# Patient Record
Sex: Male | Born: 1980
Health system: Southern US, Community
[De-identification: ages and names within clinical notes are randomized; demographics above are authoritative.]

## PROBLEM LIST (undated history)

## (undated) DIAGNOSIS — K219 Gastro-esophageal reflux disease without esophagitis: Secondary | ICD-10-CM

## (undated) DIAGNOSIS — E785 Hyperlipidemia, unspecified: Secondary | ICD-10-CM

## (undated) DIAGNOSIS — J329 Chronic sinusitis, unspecified: Secondary | ICD-10-CM

## (undated) DIAGNOSIS — J40 Bronchitis, not specified as acute or chronic: Secondary | ICD-10-CM

## (undated) HISTORY — DX: Gastro-esophageal reflux disease without esophagitis: K21.9

## (undated) HISTORY — PX: NO PAST SURGERIES: SHX2092

## (undated) HISTORY — DX: Hyperlipidemia, unspecified: E78.5

---

## 2006-12-16 ENCOUNTER — Emergency Department (HOSPITAL_COMMUNITY): Admission: EM | Admit: 2006-12-16 | Discharge: 2006-12-17 | Payer: Self-pay | Admitting: Emergency Medicine

## 2006-12-17 ENCOUNTER — Emergency Department (HOSPITAL_COMMUNITY): Admission: EM | Admit: 2006-12-17 | Discharge: 2006-12-18 | Payer: Self-pay | Admitting: Emergency Medicine

## 2009-11-07 ENCOUNTER — Emergency Department (HOSPITAL_COMMUNITY): Admission: EM | Admit: 2009-11-07 | Discharge: 2009-11-07 | Payer: Self-pay | Admitting: Emergency Medicine

## 2010-06-13 ENCOUNTER — Emergency Department (HOSPITAL_COMMUNITY): Admission: EM | Admit: 2010-06-13 | Discharge: 2010-06-13 | Payer: Self-pay | Admitting: Emergency Medicine

## 2010-06-15 ENCOUNTER — Emergency Department (HOSPITAL_COMMUNITY): Admission: EM | Admit: 2010-06-15 | Discharge: 2010-06-15 | Payer: Self-pay | Admitting: Emergency Medicine

## 2010-06-26 ENCOUNTER — Emergency Department (HOSPITAL_COMMUNITY): Admission: EM | Admit: 2010-06-26 | Discharge: 2010-06-26 | Payer: Self-pay | Admitting: Emergency Medicine

## 2010-08-28 ENCOUNTER — Telehealth: Payer: Self-pay | Admitting: Physician Assistant

## 2010-08-28 ENCOUNTER — Ambulatory Visit: Payer: Self-pay | Admitting: Physician Assistant

## 2010-08-28 DIAGNOSIS — D409 Neoplasm of uncertain behavior of male genital organ, unspecified: Secondary | ICD-10-CM

## 2010-08-28 LAB — CONVERTED CEMR LAB
Albumin: 4.5 g/dL (ref 3.5–5.2)
Alkaline Phosphatase: 51 units/L (ref 39–117)
Basophils Absolute: 0 10*3/uL (ref 0.0–0.1)
Basophils Relative: 0 % (ref 0–1)
Blood in Urine, dipstick: NEGATIVE
Calcium: 9.8 mg/dL (ref 8.4–10.5)
Creatinine, Ser: 1.01 mg/dL (ref 0.40–1.50)
Eosinophils Relative: 1 % (ref 0–5)
MCV: 86.4 fL (ref 78.0–100.0)
Monocytes Absolute: 0.5 10*3/uL (ref 0.1–1.0)
Protein, U semiquant: NEGATIVE
RBC: 5 M/uL (ref 4.22–5.81)
Total Protein: 7.1 g/dL (ref 6.0–8.3)
Urobilinogen, UA: 1
WBC Urine, dipstick: NEGATIVE
WBC: 6.8 10*3/uL (ref 4.0–10.5)
pH: 6.5

## 2010-08-29 ENCOUNTER — Encounter: Payer: Self-pay | Admitting: Physician Assistant

## 2010-09-03 ENCOUNTER — Encounter: Payer: Self-pay | Admitting: Physician Assistant

## 2010-09-11 ENCOUNTER — Encounter: Payer: Self-pay | Admitting: Physician Assistant

## 2010-09-13 ENCOUNTER — Telehealth: Payer: Self-pay | Admitting: Physician Assistant

## 2010-09-16 ENCOUNTER — Encounter (INDEPENDENT_AMBULATORY_CARE_PROVIDER_SITE_OTHER): Payer: Self-pay | Admitting: *Deleted

## 2011-01-21 NOTE — Letter (Signed)
Summary: *HSN Results Follow up  [facility]  [address] [place], [place] [address]   Phone: [phone]  Fax: [phone]      08/29/2010   [facility] [address] [place], [place]  [address]   Dear  Mr. Derrick Greene,                            ____S.Drinkard,FNP   ____D. Gore,FNP       ____B. McPherson,MD   ____V. Rankins,MD    ____E. Mulberry,MD    ____N. Daphine Deutscher, FNP  ____D. Reche Dixon, MD    ____K. Philipp Deputy, MD    __x__S. Alben Spittle, PA-C     This letter is to inform you that your recent test(s):  _______Pap Smear    ___x____Lab Test     _______X-ray    ___x____ is within acceptable limits  _______ requires a medication change  _______ requires a follow-up lab visit  _______ requires a follow-up visit with your provider   Comments: Blood counts, liver function, kidney function and thyroid tests are all normal.       _________________________________________________________ If you have any questions, please contact our office                     Sincerely,  Tereso Newcomer PA-C [facility]

## 2011-01-21 NOTE — Letter (Signed)
Summary: PT INFORMATION SHEET  PT INFORMATION SHEET   Imported By: Arta Bruce 08/29/2010 11:54:02  _____________________________________________________________________  External Attachment:    Type:   Image     Comment:   External Document

## 2011-01-21 NOTE — Letter (Signed)
Summary: REQUESTING RECORDS FROM [facility]  REQUESTING RECORDS FROM [facility]   Imported By: Arta Bruce 09/05/2010 09:39:04  _____________________________________________________________________  External Attachment:    Type:   Image     Comment:   External Document

## 2011-01-21 NOTE — Assessment & Plan Note (Signed)
Summary: NP-PEEING BLOOD/BLOCKAGAE///KT   Vital Signs:  Patient profile:   30 year old male Weight:      233 pounds Temp:     98.9 degrees F oral Pulse rate:   80 / minute Pulse rhythm:   regular Resp:     18 per minute BP sitting:   117 / 80  (left arm) Cuff size:   large  Vitals Entered By: Arm[place] Shannon (August 28, 2010 12:35 PM) CC: pt says he has a growth on his [place]... Is Patient Diabetic? No Pain Assessment Patient in pain? no       Does patient need assistance? Functional Status Self care Ambulation Normal   Primary Care Provider:  Tereso Newcomer PA-C  CC:  pt says he has a growth on his uretha....  History of Present Illness: New patient. Has noticed changes in urine stream over the last few months.  He has noticed something on the inside of his penis (in urethra at meatus).  Has noted blood on underwear in the mornings.  No h/o genital warts.  Had GC/chlamydia done in ED in June which was neg.  RPR was neg too.  Went to health dept recently and was told he would be checked for STDs and HIV.  Has not heard anything back.  No pain.  No hematuria.  No fevers.  No night sweats.  No weight loss.  No dysuria.  No frequency or urgency.    Habits & Providers  Alcohol-Tobacco-Diet     Tobacco Status: quit     Year Quit: 2011     Pack years: 5  Exercise-Depression-Behavior     Drug Use: no  Current Medications (verified): 1)  None  Allergies (verified): No Known Drug Allergies  Past History:  Past Medical History: h/o bronchitis  Past Surgical History: Denies surgical history  Family History: Family History Diabetes 1st degree relative - mom no prostate or colon cancer  Social History: Divorced 4 kids Former Smoker Alcohol use-no Drug use-no Smoking Status:  quit Drug Use:  no  Review of Systems GI:  Denies abdominal pain, change in bowel habits, constipation, and diarrhea.  Physical Exam  General:  alert, well-developed, and  well-nourished.   Head:  normocephalic and atraumatic.   Neck:  supple and no cervical lymphadenopathy.   Lungs:  normal breath sounds.   Heart:  normal rate and regular rhythm.   Abdomen:  soft, non-tender, and no hepatomegaly.   Genitalia:  circumcised, no hydrocele, no varicocele, no scrotal masses, and no testicular masses or atrophy.   2-3 small clusters of lesions at the meatus; one nodule is protruding out from opening no bloody discharge  does have small amount of clear discharge Neurologic:  alert & oriented X3 and cranial nerves II-XII intact.   Axillary Nodes:  no R axillary adenopathy and no L axillary adenopathy.   Inguinal Nodes:  no R inguinal adenopathy and no L inguinal adenopathy.     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 etiology ? condyloma check some basic general health labs get records from HD refer to urology  Orders: T-Comprehensive Metabolic Panel [phone]) T-CBC w/Diff [phone]) T-TSH [phone]) Urology Referral (Urology)  Patient Instructions: 1)  Sign form to get records from Health Department.  Need recent lab and test results. 2)  Someone will call you to arrange an appt with urology.  Laboratory Results   Urine Tests  Date/Time Received: August 28, 2010 12:40 PM  Routine Urinalysis   Glucose: negative   (Normal Range: Negative) Bilirubin: negative   (Normal Range: Negative) Ketone: negative   (Normal Range: Negative) Spec. Gravity: 1.020   (Normal Range: 1.003-1.035) Blood: negative   (Normal Range: Negative) pH: 6.5   (Normal Range: 5.0-8.0) Protein: negative   (Normal Range: Negative) Urobilinogen: 1.0   (Normal Range: 0-1) Nitrite: negative   (Normal Range: Negative) Leukocyte Esterace: negative   (Normal Range: Negative)

## 2011-01-21 NOTE — Progress Notes (Signed)
Summary: Urology Referral  Phone Note Outgoing Call   Summary of Call: Please refer to urology at [facility].  He needs to see somone soon.  He has a lesion that is worrisome that needs evaluation.  If you can find out a phone number for me to talk to someone, please send it to me.  If they are willing to see him in the next 2-3 weeks, that would be great.  Initial call taken by: Tereso Newcomer PA-C,  August 28, 2010 1:36 PM  Follow-up for Phone Call        I talk to Marcelino Duster she said that the next available appt is november but she is going to give to  the Dr the records to see if they have an appt sooner  and she will let me know  the ph # [phone] if you  want to call . Follow-up by: Cheryll Dessert,  August 30, 2010 11:27 AM  Additional Follow-up for Phone Call Additional follow up Details #1::        I have left a message with [facility] urology.  They will have a resident call me.  Please get me out of a room when they call. Tereso Newcomer PA-C  August 30, 2010 1:10 PM   I spoke with Okey Dupre and she said the office is busy... the doctor has not had a chance to look over notes but she allowed me to leave Marcelino Duster a message.Marland KitchenMarland KitchenArmenia Shannon  September 03, 2010 4:17 PM     Additional Follow-up for Phone Call Additional follow up Details #2::    Thank you. Tereso Newcomer PA-C  September 03, 2010 5:33 PM

## 2011-01-21 NOTE — Progress Notes (Signed)
Summary: Urology  Phone Note Outgoing Call   Summary of Call: Make sure Camilo Frison knows about appt 10/5 at [facility] urology.  Initial call taken by: Brynda Rim,  September 13, 2010 3:39 PM  Follow-up for Phone Call        Left message on answering machine for pt to call back...Marland KitchenMarland KitchenArmenia Shannon  September 16, 2010 9:27 AM will mail letter.. Armenia Shannon  September 16, 2010 11:10 AM        Past History:  Past Medical History: h/o bronchitis h/o chlamydia tx'd at [facility] 12.2009   Appended Document: Urology Patient kept appt with [facility] urology 10.5.2011

## 2011-01-21 NOTE — Letter (Signed)
Summary: [birth year]  1981-09-04   Imported By: Silvio Pate Stanislawscyk 09/17/2010 15:04:44  _____________________________________________________________________  External Attachment:    Type:   Image     Comment:   External Document

## 2011-01-21 NOTE — Letter (Signed)
Summary: *HSN Results Follow up  [facility]  [address] [place], [place] [address]   Phone: [phone]  Fax: [phone]      09/16/2010   [facility] [address] [place], [place]  [address]   Dear  Mr. Derrick Greene,                            ____S.Drinkard,FNP   ____D. Gore,FNP       ____B. McPherson,MD   ____V. Rankins,MD    ____E. Mulberry,MD    ____N. Daphine Deutscher, FNP  ____D. Reche Dixon, MD    ____K. Philipp Deputy, MD    ____Other     This letter is to inform you that your recent test(s):  _______Pap Smear    _______Lab Test     _______X-ray    _______ is within acceptable limits  _______ requires a medication change  _______ requires a follow-up lab visit  _______ requires a follow-up visit with your provider   Comments:  You have appointment at____________ on _______.       _________________________________________________________ If you have any questions, please contact our office                     Sincerely,  Derrick Greene [facility]

## 2011-02-12 ENCOUNTER — Emergency Department (HOSPITAL_COMMUNITY)
Admission: EM | Admit: 2011-02-12 | Discharge: 2011-02-13 | Disposition: A | Discharge status: Home / Self Care | Payer: Self-pay | Attending: Emergency Medicine | Admitting: Emergency Medicine

## 2011-02-12 ENCOUNTER — Emergency Department (HOSPITAL_COMMUNITY): Payer: Self-pay

## 2011-02-12 DIAGNOSIS — R059 Cough, unspecified: Secondary | ICD-10-CM | POA: Insufficient documentation

## 2011-02-12 DIAGNOSIS — R51 Headache: Secondary | ICD-10-CM | POA: Insufficient documentation

## 2011-02-12 DIAGNOSIS — R0989 Other specified symptoms and signs involving the circulatory and respiratory systems: Secondary | ICD-10-CM | POA: Insufficient documentation

## 2011-02-12 DIAGNOSIS — J329 Chronic sinusitis, unspecified: Secondary | ICD-10-CM | POA: Insufficient documentation

## 2011-02-12 DIAGNOSIS — J3489 Other specified disorders of nose and nasal sinuses: Secondary | ICD-10-CM | POA: Insufficient documentation

## 2011-02-12 DIAGNOSIS — J40 Bronchitis, not specified as acute or chronic: Secondary | ICD-10-CM | POA: Insufficient documentation

## 2011-02-12 DIAGNOSIS — R05 Cough: Secondary | ICD-10-CM | POA: Insufficient documentation

## 2011-02-12 DIAGNOSIS — R079 Chest pain, unspecified: Secondary | ICD-10-CM | POA: Insufficient documentation

## 2011-02-12 DIAGNOSIS — R0609 Other forms of dyspnea: Secondary | ICD-10-CM | POA: Insufficient documentation

## 2011-02-13 ENCOUNTER — Encounter (INDEPENDENT_AMBULATORY_CARE_PROVIDER_SITE_OTHER): Payer: Self-pay | Admitting: Internal Medicine

## 2011-02-18 NOTE — Medication Information (Signed)
Summary: RX Folder/SCRIPT [facility]E TO BE SIGNED BY Armonee Bojanowski  RX Folder/SCRIPT [facility]E TO BE SIGNED BY Jalon Squier   Imported By: Arta Bruce 02/14/2011 11:22:38  _____________________________________________________________________  External Attachment:    Type:   Image     Comment:   External Document

## 2011-03-09 LAB — POCT I-STAT, CHEM 8
BUN: 10 mg/dL (ref 6–23)
Chloride: 106 mEq/L (ref 96–112)
Creatinine, Ser: 1.1 mg/dL (ref 0.4–1.5)
Glucose, Bld: 105 mg/dL — ABNORMAL HIGH (ref 70–99)
HCT: 45 % (ref 39.0–52.0)
Sodium: 140 mEq/L (ref 135–145)
TCO2: 24 mmol/L (ref 0–100)

## 2011-03-09 LAB — URINALYSIS, ROUTINE W REFLEX MICROSCOPIC
Bilirubin Urine: NEGATIVE
Glucose, UA: NEGATIVE mg/dL
Hgb urine dipstick: NEGATIVE
Hgb urine dipstick: NEGATIVE
Urobilinogen, UA: 0.2 mg/dL (ref 0.0–1.0)
Urobilinogen, UA: 0.2 mg/dL (ref 0.0–1.0)
pH: 5.5 (ref 5.0–8.0)

## 2011-03-09 LAB — CBC
HCT: 42.2 % (ref 39.0–52.0)
Hemoglobin: 15.1 g/dL (ref 13.0–17.0)
MCH: 29.6 pg (ref 26.0–34.0)
MCHC: 33.2 g/dL (ref 30.0–36.0)
MCHC: 33.2 g/dL (ref 30.0–36.0)
MCV: 88.9 fL (ref 78.0–100.0)
MCV: 89.1 fL (ref 78.0–100.0)
Platelets: 239 10*3/uL (ref 150–400)
RDW: 14.3 % (ref 11.5–15.5)
WBC: 5.2 10*3/uL (ref 4.0–10.5)

## 2011-03-09 LAB — DIFFERENTIAL
Basophils Absolute: 0 10*3/uL (ref 0.0–0.1)
Basophils Relative: 1 % (ref 0–1)
Eosinophils Absolute: 0 10*3/uL (ref 0.0–0.7)
Monocytes Relative: 8 % (ref 3–12)
Monocytes Relative: 8 % (ref 3–12)
Neutro Abs: 3.2 10*3/uL (ref 1.7–7.7)

## 2011-03-09 LAB — BASIC METABOLIC PANEL
CO2: 27 mEq/L (ref 19–32)
Calcium: 10 mg/dL (ref 8.4–10.5)
Chloride: 108 mEq/L (ref 96–112)
Creatinine, Ser: 1.18 mg/dL (ref 0.4–1.5)
Glucose, Bld: 93 mg/dL (ref 70–99)

## 2011-03-09 LAB — RPR: RPR Ser Ql: NONREACTIVE

## 2011-03-09 LAB — GC/CHLAMYDIA PROBE AMP, GENITAL: Chlamydia, DNA Probe: NEGATIVE

## 2011-03-26 LAB — RAPID STREP SCREEN (MED CTR MEBANE ONLY): Streptococcus, Group A Screen (Direct): NEGATIVE

## 2011-10-17 ENCOUNTER — Inpatient Hospital Stay (INDEPENDENT_AMBULATORY_CARE_PROVIDER_SITE_OTHER)
Admission: RE | Admit: 2011-10-17 | Discharge: 2011-10-17 | Disposition: A | Discharge status: Home / Self Care | Payer: Self-pay | Source: Ambulatory Visit | Attending: Family Medicine | Admitting: Family Medicine

## 2011-10-17 DIAGNOSIS — R05 Cough: Secondary | ICD-10-CM

## 2011-10-17 DIAGNOSIS — J309 Allergic rhinitis, unspecified: Secondary | ICD-10-CM

## 2014-04-18 ENCOUNTER — Encounter (HOSPITAL_COMMUNITY): Payer: Self-pay | Admitting: Emergency Medicine

## 2014-04-18 ENCOUNTER — Emergency Department (HOSPITAL_COMMUNITY)
Admission: EM | Admit: 2014-04-18 | Discharge: 2014-04-19 | Disposition: A | Discharge status: Home / Self Care | Payer: Self-pay | Attending: Emergency Medicine | Admitting: Emergency Medicine

## 2014-04-18 ENCOUNTER — Emergency Department (HOSPITAL_COMMUNITY): Payer: Self-pay

## 2014-04-18 ENCOUNTER — Emergency Department (INDEPENDENT_AMBULATORY_CARE_PROVIDER_SITE_OTHER)
Admission: EM | Admit: 2014-04-18 | Discharge: 2014-04-18 | Disposition: A | Discharge status: Nursing Home (Includes ICF) | Payer: Self-pay | Source: Home / Self Care | Attending: Emergency Medicine | Admitting: Emergency Medicine

## 2014-04-18 DIAGNOSIS — Z87891 Personal history of nicotine dependence: Secondary | ICD-10-CM | POA: Insufficient documentation

## 2014-04-18 DIAGNOSIS — R253 Fasciculation: Secondary | ICD-10-CM

## 2014-04-18 DIAGNOSIS — R259 Unspecified abnormal involuntary movements: Secondary | ICD-10-CM | POA: Insufficient documentation

## 2014-04-18 DIAGNOSIS — Z8709 Personal history of other diseases of the respiratory system: Secondary | ICD-10-CM | POA: Insufficient documentation

## 2014-04-18 HISTORY — DX: Chronic sinusitis, unspecified: J32.9

## 2014-04-18 HISTORY — DX: Bronchitis, not specified as acute or chronic: J40

## 2014-04-18 LAB — CBC WITH DIFFERENTIAL/PLATELET
BASOS PCT: 1 % (ref 0–1)
Basophils Absolute: 0 10*3/uL (ref 0.0–0.1)
EOS ABS: 0.1 10*3/uL (ref 0.0–0.7)
Eosinophils Relative: 1 % (ref 0–5)
HCT: 41.9 % (ref 39.0–52.0)
Hemoglobin: 14.6 g/dL (ref 13.0–17.0)
LYMPHS ABS: 3.4 10*3/uL (ref 0.7–4.0)
Lymphocytes Relative: 51 % — ABNORMAL HIGH (ref 12–46)
MCH: 29.5 pg (ref 26.0–34.0)
MCHC: 34.8 g/dL (ref 30.0–36.0)
MCV: 84.6 fL (ref 78.0–100.0)
MONO ABS: 0.3 10*3/uL (ref 0.1–1.0)
Monocytes Relative: 5 % (ref 3–12)
Neutro Abs: 2.7 10*3/uL (ref 1.7–7.7)
Neutrophils Relative %: 42 % — ABNORMAL LOW (ref 43–77)
Platelets: 234 10*3/uL (ref 150–400)
RBC: 4.95 MIL/uL (ref 4.22–5.81)
RDW: 13.9 % (ref 11.5–15.5)
WBC: 6.5 10*3/uL (ref 4.0–10.5)

## 2014-04-18 LAB — BASIC METABOLIC PANEL
BUN: 17 mg/dL (ref 6–23)
CALCIUM: 9.6 mg/dL (ref 8.4–10.5)
CO2: 26 mEq/L (ref 19–32)
Chloride: 102 mEq/L (ref 96–112)
Creatinine, Ser: 1.11 mg/dL (ref 0.50–1.35)
GFR, EST NON AFRICAN AMERICAN: 87 mL/min — AB (ref >90)
Glucose, Bld: 110 mg/dL — ABNORMAL HIGH (ref 70–99)
Potassium: 4 mEq/L (ref 3.7–5.3)
Sodium: 140 mEq/L (ref 137–147)

## 2014-04-18 MED ORDER — ASPIRIN 81 MG PO CHEW
CHEWABLE_TABLET | ORAL | Status: AC
  Filled 2014-04-18: qty 4

## 2014-04-18 MED ORDER — SODIUM CHLORIDE 0.9 % IV SOLN
INTRAVENOUS | Status: DC
  Administered 2014-04-18: 22:00:00 via INTRAVENOUS

## 2014-04-18 MED ORDER — ASPIRIN 81 MG PO CHEW
324.0000 mg | CHEWABLE_TABLET | Freq: Once | ORAL | Status: AC
  Administered 2014-04-18: 324 mg via ORAL

## 2014-04-18 NOTE — ED Notes (Signed)
Report given to [facility] RN.  

## 2014-04-18 NOTE — ED Provider Notes (Signed)
CSN: [id number]     Arrival date & time 04/18/14  2211 History   First MD Initiated Contact with Patient 04/18/14 2216     Chief Complaint  Patient presents with  . Chest Pain     (Consider location/radiation/quality/duration/timing/severity/associated sxs/prior Treatment) HPI  Derrick Greene is a 33 y.o. male complaining of 2 episodes of twitching to the left side of the chest onset today. Patient also states that the left arm was twitching and that his eye was twitching. Both he and his girlfriend observed muscle twitches when this was happening. Episodes lasted about 5 minutes. He denied any sensation of palpitations, irregular heartbeat, shortness of breath, chest pain, nausea, vomiting, dizziness.  Past Medical History  Diagnosis Date  . Bronchitis   . Frequent sinus infections    History reviewed. No pertinent past surgical history. Family History  Problem Relation Age of Onset  . Diabetes Mother   . Asthma Other    History  Substance Use Topics  . Smoking status: Former Research scientist (life sciences)  . Smokeless tobacco: Not on file  . Alcohol Use: Yes     Comment: occasional    Review of Systems 10 systems reviewed and found to be negative, except as noted in the HPI   Allergies  Review of patient's allergies indicates no known allergies.  Home Medications   Prior to Admission medications   Not on File   BP 114/62  Pulse 68  Temp(Src) 98 F (36.7 C) (Oral)  Resp 14  Ht 5\' 11"  (1.803 m)  Wt 240 lb (108.863 kg)  BMI 33.49 kg/m2  SpO2 98% Physical Exam  Nursing note and vitals reviewed. Constitutional: He is oriented to person, place, and time. He appears well-developed and well-nourished. No distress.  HENT:  Head: Normocephalic.  Mouth/Throat: Oropharynx is clear and moist.  Eyes: Conjunctivae and EOM are normal. Pupils are equal, round, and reactive to light.  Cardiovascular: Normal rate, regular rhythm and intact distal pulses.   Pulmonary/Chest: Effort normal and breath  sounds normal. No stridor. No respiratory distress. He has no wheezes. He has no rales. He exhibits no tenderness.  Abdominal: Soft. Bowel sounds are normal. He exhibits no distension and no mass. There is no tenderness. There is no rebound and no guarding.  Musculoskeletal: Normal range of motion.  Neurological: He is alert and oriented to person, place, and time.  Psychiatric: He has a normal mood and affect.    ED Course  Procedures (including critical care time) Labs Review Labs Reviewed  CBC WITH DIFFERENTIAL - Abnormal; Notable for the following:    Neutrophils Relative % 42 (*)    Lymphocytes Relative 51 (*)    All other components within normal limits  BASIC METABOLIC PANEL - Abnormal; Notable for the following:    Glucose, Bld 110 (*)    GFR calc non Af Amer 87 (*)    All other components within normal limits  TROPONIN I    Imaging Review Dg Chest 2 View  04/18/2014   CLINICAL DATA:  Twitching in the anterior left chest musculature  EXAM: CHEST  2 VIEW  COMPARISON:  None.  FINDINGS: The lungs are well-expanded and clear. The heart and mediastinal structures are normal in appearance. There is no pleural effusion or pneumothorax. The observed portions of the bony thorax appear normal.  IMPRESSION: There is no evidence of active cardiopulmonary disease.   Electronically Signed   By: David  Martinique   On: 04/18/2014 23:38     EKG Interpretation  Date/Time:  Tuesday April 18 2014 22:21:23 EDT Ventricular Rate:  85 PR Interval:  163 QRS Duration: 112 QT Interval:  356 QTC Calculation: 423 R Axis:   40 Text Interpretation:  Sinus rhythm Incomplete right bundle branch block  Confirmed by Christy Gentles  MD, DONALD ([id number]) on 04/18/2014 11:59:47 PM      MDM   Final diagnoses:  Muscle twitching   Filed Vitals:   04/18/14 2219 04/19/14 0048  BP: 129/78 114/62  Pulse: 85 68  Temp: 98 F (36.7 C)   TempSrc: Oral   Resp: 18 14  Height: 5\' 11"  (1.803 m)   Weight: 240 lb  (108.863 kg)   SpO2: 100% 98%    Medications - No data to display  Derrick Greene is a 33 y.o. male presenting with chest twitching. Patient sent from urgent care concerned over a normal EKG. I have discussed the case with attending Dr. Christy Gentles who is personally evaluated the EKG. History not concerning for cardiac nature of chest discomfort. Workup is otherwise negative. Antacid given the patient's request.  Evaluation does not show pathology that would require ongoing emergent intervention or inpatient treatment. Pt is hemodynamically stable and mentating appropriately. Discussed findings and plan with patient/guardian, who agrees with care plan. All questions answered. Return precautions discussed and outpatient follow up given.   Discharge Medication List as of 04/19/2014 12:42 AM    START taking these medications   Details  famotidine (PEPCID) 40 MG tablet Take 0.5 tablets (20 mg total) by mouth daily. OTC, Starting 04/19/2014, Until Discontinued, Print        Note: Portions of this report may have been transcribed using voice recognition software. Every effort was made to ensure accuracy; however, inadvertent computerized transcription errors may be present      Monico Blitz, PA-C 04/20/14 1923

## 2014-04-18 NOTE — Discharge Instructions (Signed)
We have determined that your problem requires further evaluation in the emergency department.  We will take care of your transport there.  Once at the emergency department, you will be evaluated by a provider and they will order whatever treatment or tests they deem necessary.  We cannot guarantee that they will do any specific test or do any specific treatment.  ° °

## 2014-04-18 NOTE — ED Notes (Signed)
Monitor and O2 -2 L / per Joaquim Nam EMT.

## 2014-04-18 NOTE — ED Notes (Signed)
Phlebotomy at the bedside. Xray to take patient after phlebotomy finishes.

## 2014-04-18 NOTE — ED Notes (Signed)
C/o twitching in L chest onset today @ 0800 and again @ 1800 and lasted 10 min.  Pt. was anxious about it.  Hx. Heart murmer.  States he wakes up and spits up in his sleep especially after eating spaghetti.  He thinks he has GERD.

## 2014-04-18 NOTE — ED Notes (Signed)
Per [facility], Patient went to [facility] where he complains of his "chest fluttering" twice today. Urgent care started an IV and gave the patient 324 of Aspirin. Patient denies any pain. Vitals per [facility]: 133/83, 100 % on RA, 80 HR, 20 RR.

## 2014-04-18 NOTE — ED Provider Notes (Addendum)
Chief Complaint   Chief Complaint  Patient presents with  . Spasms    History of Present Illness    Derrick Greene is a 33 year old male without any cardiac history or risk factors who noticed some muscle twitching in the left pectoral area. This would come and go throughout the day. He denies any pain, pressure, tightness, or discomfort in that area. He's had no shortness of breath, nausea, or diaphoresis. He's also had some twitching of his eye lids and muscles of his arms. He denies any vomiting or diarrhea. There no new medications. He denies any muscle pain, weakness, or paresthesias. The patient requested that an EKG be done, since he is concerned about heart issues.  Review of Systems    Other than noted above, the patient denies any of the following symptoms. Systemic:  No fever or chills. Pulmonary:  No cough, wheezing, shortness of breath, sputum production, hemoptysis. Cardiac:  No palpitations, rapid heartbeat, dizziness, presyncope or syncope. GI:  No abdominal pain, heartburn, nausea, or vomiting. Ext:  No leg pain or swelling.  [place]    Past medical history, family history, social history, meds, and allergies were reviewed. He has a history of reflux esophagitis but does not take any medication for it now.  Physical Exam     Vital signs:  There were no vitals taken for this visit. Gen:  Alert, oriented, in no distress, skin warm and dry. Eye:  PERRL, lids and conjunctivas normal.  Sclera non-icteric. ENT:  Mucous membranes moist, pharynx clear. Neck:  Supple, no adenopathy or tenderness.  No JVD. Lungs:  Clear to auscultation, no wheezes, rales or rhonchi.  No respiratory distress. Heart:  Regular rhythm.  No gallops, murmers, clicks or rubs. Chest:  No chest wall tenderness. There is no muscle twitching. Abdomen:  Soft, nontender, no organomegaly or mass.  Bowel sounds normal.  No pulsatile abdominal mass or bruit. Ext:  No edema.  No calf tenderness and Homann's  sign negative.  Pulses full and equal. Skin:  Warm and dry.  No rash. The the eye and I cannot feel the same way was minimal resistance for a routine rule out to get troponin to make sure" and ovale and   EKG Results:  Date: 04/18/2014  Rate: 74  Rhythm: normal sinus rhythm  QRS Axis: normal  Intervals: normal  ST/T Wave abnormalities: ST elevations anteriorly  Conduction Disutrbances:right bundle branch block  Narrative Interpretation: Normal sinus rhythm, incomplete right bundle branch block, ST segment elevation, particularly in V2 and also to a lesser extent in aVL.  Old EKG Reviewed: none available    Course in Urgent [place]         He was begun on IV normal saline and given aspirin 325 mg by mouth. The EKG was refused by Dr. Terrence [place] who felt that this was unlikely to be a true STEMI, but that he should be sent to the emergency department for a routine rule out MI.  Assessment     The encounter diagnosis was Muscle twitching.  ST segment elevation on EKG, no old EKG for comparison, he is not having any chest pain right now, but with the EKG changes, he needs to be ruled out for MI.  Plan     The patient was transferred to the ED via [place] in stable condition.  Medical Decision Making:  33 year old male had muscle twitching in left pectoral area this afternoon.  He denies any chest pain or shortness of breath.  He requested an EKG be done and was found to have ST elevation in V2 and AVL.  He is being sent to the ED to be ruled out for MI.       Harden Mo, MD 04/18/14 2156  Harden Mo, MD 04/18/14 [phone]

## 2014-04-18 NOTE — ED Notes (Signed)
[facility] did not have a truck avail.  EMS has been dispatched.

## 2014-04-19 LAB — TROPONIN I: Troponin I: <0.3 ng/mL (ref <0.30)

## 2014-04-19 MED ORDER — FAMOTIDINE 40 MG PO TABS: 20.0000 mg | ORAL_TABLET | Freq: Every day | ORAL | Status: DC

## 2014-04-19 NOTE — Discharge Instructions (Signed)
Please contact your primary care doctor and let them know that you were seen in the emergency room. They must obtain records for evaluation and further management.   Follow with your primary care doctor for a check up in the next 24-48 hours.  Return to the emergency room IMMEDIATELY if you have any NEW or WORSENING symptoms   Muscle Cramps and Spasms Muscle cramps and spasms occur when a muscle or muscles tighten and you have no control over this tightening (involuntary muscle contraction). They are a common problem and can develop in any muscle. The most common place is in the calf muscles of the leg. Both muscle cramps and muscle spasms are involuntary muscle contractions, but they also have differences:   Muscle cramps are sporadic and painful. They may last a few seconds to a quarter of an hour. Muscle cramps are often more forceful and last longer than muscle spasms.  Muscle spasms may or may not be painful. They may also last just a few seconds or much longer. CAUSES  It is uncommon for cramps or spasms to be due to a serious underlying problem. In many cases, the cause of cramps or spasms is unknown. Some common causes are:   Overexertion.   Overuse from repetitive motions (doing the same thing over and over).   Remaining in a certain position for a long period of time.   Improper preparation, form, or technique while performing a sport or activity.   Dehydration.   Injury.   Side effects of some medicines.   Abnormally low levels of the salts and ions in your blood (electrolytes), especially potassium and calcium. This could happen if you are taking water pills (diuretics) or you are pregnant.  Some underlying medical problems can make it more likely to develop cramps or spasms. These include, but are not limited to:   Diabetes.   Parkinson disease.   Hormone disorders, such as thyroid problems.   Alcohol abuse.   Diseases specific to muscles, joints, and  bones.   Blood vessel disease where not enough blood is getting to the muscles.  HOME CARE INSTRUCTIONS   Stay well hydrated. Drink enough water and fluids to keep your urine clear or pale yellow.  It may be helpful to massage, stretch, and relax the affected muscle.  For tight or tense muscles, use a warm towel, heating pad, or hot shower water directed to the affected area.  If you are sore or have pain after a cramp or spasm, applying ice to the affected area may relieve discomfort.  Put ice in a plastic bag.  Place a towel between your skin and the bag.  Leave the ice on for 15-20 minutes, 03-04 times a day.  Medicines used to treat a known cause of cramps or spasms may help reduce their frequency or severity. Only take over-the-counter or prescription medicines as directed by your caregiver. SEEK MEDICAL CARE IF:  Your cramps or spasms get more severe, more frequent, or do not improve over time.  MAKE SURE YOU:   Understand these instructions.  Will watch your condition.  Will get help right away if you are not doing well or get worse. Document Released: 05/30/2002 Document Revised: 04/04/2013 Document Reviewed: 11/24/2012 [facility] Patient Information 2014 [place], [place].

## 2014-04-21 NOTE — ED Provider Notes (Signed)
Medical screening examination/treatment/procedure(s) were performed by non-physician practitioner and as supervising physician I was immediately available for consultation/collaboration.   EKG Interpretation   Date/Time:  Tuesday April 18 2014 22:21:23 EDT Ventricular Rate:  85 PR Interval:  163 QRS Duration: 112 QT Interval:  356 QTC Calculation: 423 R Axis:   40 Text Interpretation:  Sinus rhythm Incomplete right bundle branch block  Confirmed by Christy Gentles  MD, Fathima Bartl ([id number]) on 04/18/2014 11:59:47 PM        Sharyon Cable, MD 04/21/14 [id number]

## 2014-04-26 ENCOUNTER — Ambulatory Visit: Payer: Self-pay | Attending: Internal Medicine | Admitting: Internal Medicine

## 2014-04-26 ENCOUNTER — Encounter: Payer: Self-pay | Admitting: Internal Medicine

## 2014-04-26 VITALS — BP 95/62 | HR 87 | Temp 98.8°F | Resp 14 | Ht 72.0 in | Wt 241.1 lb

## 2014-04-26 DIAGNOSIS — K219 Gastro-esophageal reflux disease without esophagitis: Secondary | ICD-10-CM | POA: Insufficient documentation

## 2014-04-26 DIAGNOSIS — J40 Bronchitis, not specified as acute or chronic: Secondary | ICD-10-CM | POA: Insufficient documentation

## 2014-04-26 DIAGNOSIS — J329 Chronic sinusitis, unspecified: Secondary | ICD-10-CM | POA: Insufficient documentation

## 2014-04-26 DIAGNOSIS — Z7689 Persons encountering health services in other specified circumstances: Secondary | ICD-10-CM

## 2014-04-26 DIAGNOSIS — Z7189 Other specified counseling: Secondary | ICD-10-CM

## 2014-04-26 NOTE — Progress Notes (Signed)
Patient here to establish care  States 2 year history of GERD

## 2014-04-26 NOTE — Patient Instructions (Signed)
Gastroesophageal Reflux Disease, Adult  Gastroesophageal reflux disease (GERD) happens when acid from your stomach flows up into the esophagus. When acid comes in contact with the esophagus, the acid causes soreness (inflammation) in the esophagus. Over time, GERD may create small holes (ulcers) in the lining of the esophagus.  CAUSES   · Increased body weight. This puts pressure on the stomach, making acid rise from the stomach into the esophagus.  · Smoking. This increases acid production in the stomach.  · Drinking alcohol. This causes decreased pressure in the lower esophageal sphincter (valve or ring of muscle between the esophagus and stomach), allowing acid from the stomach into the esophagus.  · Late evening meals and a full stomach. This increases pressure and acid production in the stomach.  · A malformed lower esophageal sphincter.  Sometimes, no cause is found.  SYMPTOMS   · Burning pain in the lower part of the mid-chest behind the breastbone and in the mid-stomach area. This may occur twice a week or more often.  · Trouble swallowing.  · Sore throat.  · Dry cough.  · Asthma-like symptoms including chest tightness, shortness of breath, or wheezing.  DIAGNOSIS   Your caregiver may be able to diagnose GERD based on your symptoms. In some cases, X-rays and other tests may be done to check for complications or to check the condition of your stomach and esophagus.  TREATMENT   Your caregiver may recommend over-the-counter or prescription medicines to help decrease acid production. Ask your caregiver before starting or adding any new medicines.   HOME CARE INSTRUCTIONS   · Change the factors that you can control. Ask your caregiver for guidance concerning weight loss, quitting smoking, and alcohol consumption.  · Avoid foods and drinks that make your symptoms worse, such as:  · Caffeine or alcoholic drinks.  · Chocolate.  · Peppermint or mint flavorings.  · Garlic and onions.  · Spicy foods.  · Citrus fruits,  such as oranges, lemons, or limes.  · Tomato-based foods such as sauce, chili, salsa, and pizza.  · Fried and fatty foods.  · Avoid lying down for the 3 hours prior to your bedtime or prior to taking a nap.  · Eat small, frequent meals instead of large meals.  · Wear loose-fitting clothing. Do not wear anything tight around your waist that causes pressure on your stomach.  · Raise the head of your bed 6 to 8 inches with wood blocks to help you sleep. Extra pillows will not help.  · Only take over-the-counter or prescription medicines for pain, discomfort, or fever as directed by your caregiver.  · Do not take aspirin, ibuprofen, or other nonsteroidal anti-inflammatory drugs (NSAIDs).  SEEK IMMEDIATE MEDICAL CARE IF:   · You have pain in your arms, neck, jaw, teeth, or back.  · Your pain increases or changes in intensity or duration.  · You develop nausea, vomiting, or sweating (diaphoresis).  · You develop shortness of breath, or you faint.  · Your vomit is green, yellow, black, or looks like coffee grounds or blood.  · Your stool is red, bloody, or black.  These symptoms could be signs of other problems, such as heart disease, gastric bleeding, or esophageal bleeding.  MAKE SURE YOU:   · Understand these instructions.  · Will watch your condition.  · Will get help right away if you are not doing well or get worse.  Document Released: 09/17/2005 Document Revised: 03/01/2012 Document Reviewed: 06/27/2011  [employer]® Patient   Information ©2014 [employer].

## 2014-04-27 NOTE — Progress Notes (Signed)
Patient ID: Derrick Greene, male   DOB: 31-Mar-1981, 31 y.o.   MRN: [id number]  EGB:[id number]  [id number]  DOB - 08/28/1981  CC:  Chief Complaint  Patient presents with  . Establish Care  . Gastrophageal Reflux       HPI: Derrick Greene is a 33 y.o. male here today to establish medical care.  Patient reports that he was seen in the emergency department over a week ago for acid reflux. Patient states that he was vomiting in his sleep and epigastric pain. This reports that he was given Pepcid in the emergency department which has helped his symptoms. Patient states he is only taking Pepcid as needed and continues to have intermittent symptoms.   No Known Allergies Past Medical History  Diagnosis Date  . Bronchitis   . Frequent sinus infections   . GERD (gastroesophageal reflux disease)    Current Outpatient Prescriptions on File Prior to Visit  Medication Sig Dispense Refill  . famotidine (PEPCID) 40 MG tablet Take 0.5 tablets (20 mg total) by mouth daily. OTC  30 tablet  0   No current facility-administered medications on file prior to visit.   Family History  Problem Relation Age of Onset  . Diabetes Mother   . Asthma Other    History   Social History  . Marital Status: Divorced    Spouse Name: N/A    Number of Children: N/A  . Years of Education: N/A   Occupational History  . Not on file.   Social History Main Topics  . Smoking status: Former Research scientist (life sciences)  . Smokeless tobacco: Not on file  . Alcohol Use: Yes     Comment: occasional  . Drug Use: No  . Sexual Activity: Not on file   Other Topics Concern  . Not on file   Social History Narrative  . No narrative on file    Review of Systems: Constitutional: Negative for fever, chills, diaphoresis, activity change, appetite change and fatigue. HENT: Negative for ear pain, nosebleeds, congestion, facial swelling, rhinorrhea, neck pain, neck stiffness and ear discharge.  Eyes: Negative for pain, discharge, redness,  itching and visual disturbance. Respiratory: Negative for cough, choking, chest tightness, shortness of breath, wheezing and stridor.  Cardiovascular: Negative for chest pain, palpitations and leg swelling. Gastrointestinal: Negative for abdominal distention. Positive for intermittent heartburn and epigastric pain Genitourinary: Negative for dysuria, urgency, frequency, hematuria, flank pain, decreased urine volume, difficulty urinating and dyspareunia.  Musculoskeletal: Negative for back pain, joint swelling, arthralgia and gait problem. Neurological: Negative for dizziness, tremors, seizures, syncope, facial asymmetry, speech difficulty, weakness, light-headedness, numbness and headaches.  Hematological: Negative for adenopathy. Does not bruise/bleed easily. Psychiatric/Behavioral: Negative for hallucinations, behavioral problems, confusion, dysphoric mood, decreased concentration and agitation.    Objective:   Filed Vitals:   04/26/14 1558  BP: 95/62  Pulse: 87  Temp: 98.8 F (37.1 C)  Resp: 14    Physical Exam: Constitutional: Patient appears well-developed and well-nourished. No distress. HENT: Normocephalic, atraumatic, External right and left ear normal. Oropharynx is clear and moist.  Eyes: Conjunctivae and EOM are normal. PERRLA, no scleral icterus. Neck: Normal ROM. Neck supple. No JVD. No tracheal deviation. No thyromegaly. CVS: RRR, S1/S2 +, no murmurs, no gallops, no carotid bruit.  Pulmonary: Effort and breath sounds normal, no stridor, rhonchi, wheezes, rales.  Abdominal: Soft. BS +, no distension, tenderness, rebound or guarding.  Musculoskeletal: Normal range of motion. No edema and no tenderness.  Lymphadenopathy: No lymphadenopathy noted, cervical Neuro: Alert. Normal reflexes,  muscle tone coordination. No cranial nerve deficit. Skin: Skin is warm and dry. No rash noted. Not diaphoretic. No erythema. No pallor. Psychiatric: Normal mood and affect. Behavior,  judgment, thought content normal.  Lab Results  Component Value Date   WBC 6.5 04/18/2014   HGB 14.6 04/18/2014   HCT 41.9 04/18/2014   MCV 84.6 04/18/2014   PLT 234 04/18/2014   Lab Results  Component Value Date   CREATININE 1.11 04/18/2014   BUN 17 04/18/2014   NA 140 04/18/2014   K 4.0 04/18/2014   CL 102 04/18/2014   CO2 26 04/18/2014    No results found for this basename: HGBA1C   Lipid Panel  No results found for this basename: chol, trig, hdl, cholhdl, vldl, ldlcalc       Assessment and plan:   Andric was seen today for establish care and gastrophageal reflux.  Diagnoses and associated orders for this visit:  Acid reflux Patient may continue current regimen of Pepcid. If continued to have symptoms with Pepcid may return for medication review. Patient education provided on proper way to take medication to prevent symptoms. Diet discussed.  Encounter to establish care - TSH; Future - Hemoglobin A1c; Future - Lipid panel; Future  Return in about 1 week (around 05/03/2014), or if symptoms worsen or fail to improve, for Lab Visit.    Chari Manning, NP-C [facility] [phone] 04/27/2014, 3:19 PM

## 2014-05-03 ENCOUNTER — Encounter: Payer: Self-pay | Admitting: Internal Medicine

## 2014-05-04 ENCOUNTER — Other Ambulatory Visit: Payer: Self-pay

## 2014-05-04 ENCOUNTER — Encounter: Payer: Self-pay | Admitting: Internal Medicine

## 2014-07-21 ENCOUNTER — Emergency Department (HOSPITAL_COMMUNITY)
Admission: EM | Admit: 2014-07-21 | Discharge: 2014-07-21 | Disposition: A | Discharge status: Home / Self Care | Payer: Self-pay | Attending: Emergency Medicine | Admitting: Emergency Medicine

## 2014-07-21 ENCOUNTER — Emergency Department (HOSPITAL_COMMUNITY): Payer: Self-pay

## 2014-07-21 ENCOUNTER — Encounter (HOSPITAL_COMMUNITY): Payer: Self-pay | Admitting: Emergency Medicine

## 2014-07-21 DIAGNOSIS — S0993XA Unspecified injury of face, initial encounter: Secondary | ICD-10-CM | POA: Insufficient documentation

## 2014-07-21 DIAGNOSIS — H05229 Edema of unspecified orbit: Secondary | ICD-10-CM | POA: Insufficient documentation

## 2014-07-21 DIAGNOSIS — Z8709 Personal history of other diseases of the respiratory system: Secondary | ICD-10-CM | POA: Insufficient documentation

## 2014-07-21 DIAGNOSIS — S199XXA Unspecified injury of neck, initial encounter: Secondary | ICD-10-CM

## 2014-07-21 DIAGNOSIS — S0990XA Unspecified injury of head, initial encounter: Secondary | ICD-10-CM | POA: Insufficient documentation

## 2014-07-21 DIAGNOSIS — K219 Gastro-esophageal reflux disease without esophagitis: Secondary | ICD-10-CM | POA: Insufficient documentation

## 2014-07-21 DIAGNOSIS — Z87891 Personal history of nicotine dependence: Secondary | ICD-10-CM | POA: Insufficient documentation

## 2014-07-21 MED ORDER — TRAMADOL HCL 50 MG PO TABS: 50.0000 mg | ORAL_TABLET | Freq: Four times a day (QID) | ORAL | Status: DC | PRN

## 2014-07-21 NOTE — ED Provider Notes (Signed)
Medical screening examination/treatment/procedure(s) were performed by non-physician practitioner and as supervising physician I was immediately available for consultation/collaboration.   EKG Interpretation None        Blanchard Kelch, MD 07/21/14 [phone]
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
## 2014-07-21 NOTE — Discharge Instructions (Signed)
Take Tramadol as needed for pain. Refer to attached documents for more information. Follow up with your doctor as needed.  °
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
## 2015-03-18 ENCOUNTER — Emergency Department (INDEPENDENT_AMBULATORY_CARE_PROVIDER_SITE_OTHER)
Admission: EM | Admit: 2015-03-18 | Discharge: 2015-03-18 | Disposition: A | Discharge status: Home / Self Care | Payer: Self-pay | Source: Home / Self Care | Attending: Family Medicine | Admitting: Family Medicine

## 2015-03-18 ENCOUNTER — Encounter (HOSPITAL_COMMUNITY): Payer: Self-pay | Admitting: Emergency Medicine

## 2015-03-18 DIAGNOSIS — J209 Acute bronchitis, unspecified: Secondary | ICD-10-CM

## 2015-03-18 DIAGNOSIS — M542 Cervicalgia: Secondary | ICD-10-CM

## 2015-03-18 MED ORDER — HYDROCOD POLST-CHLORPHEN POLST 10-8 MG/5ML PO LQCR: 5.0000 mL | Freq: Two times a day (BID) | ORAL | Status: DC | PRN

## 2015-03-18 NOTE — ED Provider Notes (Signed)
CSN: [id number]     Arrival date & time 03/18/15  1342 History   First MD Initiated Contact with Patient 03/18/15 1409     Chief Complaint  Patient presents with  . Cough   (Consider location/radiation/quality/duration/timing/severity/associated sxs/prior Treatment) HPI             34 year old male presents complaining of cough and neck pain. This started approximately 5 days ago. He had cough, subjective fever and chills, sinus pain and pressure, and pain in his lower back. He is still having the cough but the other symptoms are mostly gotten better. 2 days ago he started having pain in the back of his neck. He has noted that this is worse with looking up at the ceiling. He denies any further fever, nausea, vomiting, or headache. He denies any falls or trauma. He has never had pain in this area before. Denies any rash. He researched his symptoms online and he is concerned about meningitis.  Past Medical History  Diagnosis Date  . Bronchitis   . Frequent sinus infections   . GERD (gastroesophageal reflux disease)    History reviewed. No pertinent past surgical history. Family History  Problem Relation Age of Onset  . Diabetes Mother   . Asthma Other    History  Substance Use Topics  . Smoking status: Former Research scientist (life sciences)  . Smokeless tobacco: Not on file  . Alcohol Use: Yes     Comment: occasional    Review of Systems  Constitutional: Positive for chills and fatigue. Negative for fever.  HENT: Positive for rhinorrhea and sinus pressure. Negative for ear pain and sore throat.   Respiratory: Positive for cough. Negative for shortness of breath.   Cardiovascular: Negative for chest pain.  Gastrointestinal: Negative for nausea, vomiting, abdominal pain and diarrhea.  Musculoskeletal: Positive for neck pain. Negative for neck stiffness.  Skin: Negative for rash.  All other systems reviewed and are negative.   Allergies  Review of patient's allergies indicates no known allergies.  Home  Medications   Prior to Admission medications   Medication Sig Start Date End Date Taking? Authorizing Provider  calcium carbonate (TUMS - DOSED IN MG ELEMENTAL CALCIUM) 500 MG chewable tablet Chew 1 tablet by mouth daily as needed for indigestion or heartburn.    Historical Provider, MD  chlorpheniramine-HYDROcodone (TUSSIONEX PENNKINETIC ER) 10-8 MG/5ML LQCR Take 5 mLs by mouth every 12 (twelve) hours as needed for cough. 03/18/15   Liam Graham, PA-C  ibuprofen (ADVIL,MOTRIN) 200 MG tablet Take 400 mg by mouth every 6 (six) hours as needed for fever, headache or mild pain.    Historical Provider, MD  traMADol (ULTRAM) 50 MG tablet Take 1 tablet (50 mg total) by mouth every 6 (six) hours as needed. 07/21/14   Kaitlyn Szekalski, PA-C   BP 128/84 mmHg  Pulse 77  Temp(Src) 98.5 F (36.9 C) (Oral)  Resp 16  SpO2 98% Physical Exam  Constitutional: He is oriented to person, place, and time. He appears well-developed and well-nourished. No distress.  HENT:  Head: Normocephalic and atraumatic.  Right Ear: External ear normal.  Left Ear: External ear normal.  Nose: Nose normal.  Mouth/Throat: Uvula is midline, oropharynx is clear and moist and mucous membranes are normal. No oropharyngeal exudate.  Eyes: Conjunctivae are normal.  Neck: Trachea normal, normal range of motion and full passive range of motion without pain. Neck supple. Muscular tenderness present. No spinous process tenderness present. Normal range of motion present. No Brudzinski's sign and no  Kernig's sign noted.  Cardiovascular: Normal rate, regular rhythm and normal heart sounds.   Pulmonary/Chest: Effort normal and breath sounds normal. No respiratory distress. He has no wheezes. He has no rales.  Abdominal: Soft. He exhibits no mass. There is no tenderness. There is no rebound and no guarding.  Lymphadenopathy:    He has no cervical adenopathy.  Neurological: He is alert and oriented to person, place, and time. Coordination  normal.  Skin: Skin is warm and dry. No rash noted. He is not diaphoretic.  Psychiatric: He has a normal mood and affect. Judgment normal.  Nursing note and vitals reviewed.   ED Course  Procedures (including critical care time) Labs Review Labs Reviewed - No data to display  Imaging Review No results found.   MDM   1. Acute bronchitis, unspecified organism   2. Neck pain    He is concerned about meningitis. He has neck pain but no neck stiffness, negative Kernig's and Brudzinski's signs. No headache or fever. The most likely is having musculoskeletal pain as well as bronchitis. Treat with Tussionex cough suppressant. Follow-up when necessary  Meds ordered this encounter  Medications  . chlorpheniramine-HYDROcodone (TUSSIONEX PENNKINETIC ER) 10-8 MG/5ML LQCR    Sig: Take 5 mLs by mouth every 12 (twelve) hours as needed for cough.    Dispense:  115 mL    Refill:  0     Liam Graham, PA-C 03/18/15 1454

## 2015-03-18 NOTE — ED Notes (Signed)
C/o  A persistent non productive cough since last Tuesday. Pain in back of neck.   Having no relief with taking nite quil and day quil.  Taking ibuprofen for pain relief.   Denies fever, n/v/d

## 2015-03-18 NOTE — Discharge Instructions (Signed)
You have bronchitis. This is a viral infection that causes of cough that can last up to 3-6 weeks. Take this cough medication as needed, when this is gone you may use a cough suppressant such as Delsym. Increase fluids. If you start to get sicker come back to be checked again.  Acute Bronchitis Bronchitis is inflammation of the airways that extend from the windpipe into the lungs (bronchi). The inflammation often causes mucus to develop. This leads to a cough, which is the most common symptom of bronchitis.  In acute bronchitis, the condition usually develops suddenly and goes away over time, usually in a couple weeks. Smoking, allergies, and asthma can make bronchitis worse. Repeated episodes of bronchitis may cause further lung problems.  CAUSES Acute bronchitis is most often caused by the same virus that causes a cold. The virus can spread from person to person (contagious) through coughing, sneezing, and touching contaminated objects. SIGNS AND SYMPTOMS   Cough.   Fever.   Coughing up mucus.   Body aches.   Chest congestion.   Chills.   Shortness of breath.   Sore throat.  DIAGNOSIS  Acute bronchitis is usually diagnosed through a physical exam. Your health care provider will also ask you questions about your medical history. Tests, such as chest X-rays, are sometimes done to rule out other conditions.  TREATMENT  Acute bronchitis usually goes away in a couple weeks. Oftentimes, no medical treatment is necessary. Medicines are sometimes given for relief of fever or cough. Antibiotic medicines are usually not needed but may be prescribed in certain situations. In some cases, an inhaler may be recommended to help reduce shortness of breath and control the cough. A cool mist vaporizer may also be used to help thin bronchial secretions and make it easier to clear the chest.  HOME CARE INSTRUCTIONS  Get plenty of rest.   Drink enough fluids to keep your urine clear or pale  yellow (unless you have a medical condition that requires fluid restriction). Increasing fluids may help thin your respiratory secretions (sputum) and reduce chest congestion, and it will prevent dehydration.   Take medicines only as directed by your health care provider.  If you were prescribed an antibiotic medicine, finish it all even if you start to feel better.  Avoid smoking and secondhand smoke. Exposure to cigarette smoke or irritating chemicals will make bronchitis worse. If you are a smoker, consider using nicotine gum or skin patches to help control withdrawal symptoms. Quitting smoking will help your lungs heal faster.   Reduce the chances of another bout of acute bronchitis by washing your hands frequently, avoiding people with cold symptoms, and trying not to touch your hands to your mouth, nose, or eyes.   Keep all follow-up visits as directed by your health care provider.  SEEK MEDICAL CARE IF: Your symptoms do not improve after 1 week of treatment.  SEEK IMMEDIATE MEDICAL CARE IF:  You develop an increased fever or chills.   You have chest pain.   You have severe shortness of breath.  You have bloody sputum.   You develop dehydration.  You faint or repeatedly feel like you are going to pass out.  You develop repeated vomiting.  You develop a severe headache. MAKE SURE YOU:   Understand these instructions.  Will watch your condition.  Will get help right away if you are not doing well or get worse. Document Released: 01/15/2005 Document Revised: 04/24/2014 Document Reviewed: 05/31/2013 [facility] Patient Information 2015 [place], [place].  This information is not intended to replace advice given to you by your health care provider. Make sure you discuss any questions you have with your health care provider. ° °

## 2015-04-13 ENCOUNTER — Emergency Department (INDEPENDENT_AMBULATORY_CARE_PROVIDER_SITE_OTHER)
Admission: EM | Admit: 2015-04-13 | Discharge: 2015-04-13 | Disposition: A | Discharge status: Home / Self Care | Payer: Self-pay | Source: Home / Self Care | Attending: Family Medicine | Admitting: Family Medicine

## 2015-04-13 ENCOUNTER — Emergency Department (INDEPENDENT_AMBULATORY_CARE_PROVIDER_SITE_OTHER): Payer: Self-pay

## 2015-04-13 ENCOUNTER — Encounter (HOSPITAL_COMMUNITY): Payer: Self-pay | Admitting: Emergency Medicine

## 2015-04-13 DIAGNOSIS — J209 Acute bronchitis, unspecified: Secondary | ICD-10-CM

## 2015-04-13 MED ORDER — PSEUDOEPH-BROMPHEN-DM 30-2-10 MG/5ML PO SYRP: 10.0000 mL | ORAL_SOLUTION | ORAL | Status: DC | PRN

## 2015-04-13 MED ORDER — PREDNISONE 50 MG PO TABS: 50.0000 mg | ORAL_TABLET | Freq: Every day | ORAL | Status: DC

## 2015-04-13 MED ORDER — ALBUTEROL SULFATE HFA 108 (90 BASE) MCG/ACT IN AERS: 2.0000 | INHALATION_SPRAY | RESPIRATORY_TRACT | Status: AC | PRN

## 2015-04-13 NOTE — ED Provider Notes (Signed)
CSN: [id number]     Arrival date & time 04/13/15  1057 History   First MD Initiated Contact with Patient 04/13/15 1141     Chief Complaint  Patient presents with  . URI   (Consider location/radiation/quality/duration/timing/severity/associated sxs/prior Treatment) HPI        34 year old male presents for evaluation of cough, wheezing, sneezing, rhinorrhea. He was seen here about 3-1/2 weeks ago and was diagnosed with bronchitis. Treated with cough suppressant. He got better and was almost completely better until this last Sunday when he decided to smoke some black and mild and now his symptoms have returned. No chest pain or shortness of breath. No fever  Past Medical History  Diagnosis Date  . Bronchitis   . Frequent sinus infections   . GERD (gastroesophageal reflux disease)    History reviewed. No pertinent past surgical history. Family History  Problem Relation Age of Onset  . Diabetes Mother   . Asthma Other    History  Substance Use Topics  . Smoking status: Former Research scientist (life sciences)  . Smokeless tobacco: Not on file  . Alcohol Use: Yes     Comment: occasional    Review of Systems  Constitutional: Negative for fever and chills.  HENT: Positive for congestion, rhinorrhea and sneezing. Negative for ear pain, sinus pressure and trouble swallowing.   Respiratory: Positive for cough and wheezing.   Cardiovascular: Negative for chest pain.  Neurological: Positive for headaches.  All other systems reviewed and are negative.   Allergies  Review of patient's allergies indicates no known allergies.  Home Medications   Prior to Admission medications   Medication Sig Start Date End Date Taking? Authorizing Provider  albuterol (PROVENTIL HFA;VENTOLIN HFA) 108 (90 BASE) MCG/ACT inhaler Inhale 2 puffs into the lungs every 4 (four) hours as needed for wheezing. 04/13/15   Liam Graham, PA-C  brompheniramine-pseudoephedrine-DM 30-2-10 MG/5ML syrup Take 10 mLs by mouth every 4 (four) hours  as needed. 04/13/15   Liam Graham, PA-C  calcium carbonate (TUMS - DOSED IN MG ELEMENTAL CALCIUM) 500 MG chewable tablet Chew 1 tablet by mouth daily as needed for indigestion or heartburn.    Historical Provider, MD  chlorpheniramine-HYDROcodone (TUSSIONEX PENNKINETIC ER) 10-8 MG/5ML LQCR Take 5 mLs by mouth every 12 (twelve) hours as needed for cough. 03/18/15   Liam Graham, PA-C  ibuprofen (ADVIL,MOTRIN) 200 MG tablet Take 400 mg by mouth every 6 (six) hours as needed for fever, headache or mild pain.    Historical Provider, MD  predniSONE (DELTASONE) 50 MG tablet Take 1 tablet (50 mg total) by mouth daily with breakfast. 04/13/15   Liam Graham, PA-C  traMADol (ULTRAM) 50 MG tablet Take 1 tablet (50 mg total) by mouth every 6 (six) hours as needed. 07/21/14   Kaitlyn Szekalski, PA-C   BP 123/81 mmHg  Pulse 78  Temp(Src) 98.5 F (36.9 C) (Oral)  Resp 16  SpO2 100% Physical Exam  Constitutional: He is oriented to person, place, and time. He appears well-developed and well-nourished. No distress.  HENT:  Head: Normocephalic and atraumatic.  Right Ear: Tympanic membrane, external ear and ear canal normal.  Left Ear: Tympanic membrane, external ear and ear canal normal.  Nose: Nose normal. Right sinus exhibits no maxillary sinus tenderness and no frontal sinus tenderness. Left sinus exhibits no maxillary sinus tenderness and no frontal sinus tenderness.  Mouth/Throat: Uvula is midline, oropharynx is clear and moist and mucous membranes are normal. No oropharyngeal exudate.  Eyes: Conjunctivae are normal.  Neck: Normal range of motion. Neck supple. No JVD present.  Cardiovascular: Normal rate, regular rhythm, normal heart sounds and intact distal pulses.   Pulmonary/Chest: Effort normal and breath sounds normal. No respiratory distress. He has no wheezes. He has no rales.  Lymphadenopathy:    He has no cervical adenopathy.  Neurological: He is alert and oriented to person, place, and  time. Coordination normal.  Skin: Skin is warm and dry. No rash noted. He is not diaphoretic.  Psychiatric: He has a normal mood and affect. Judgment normal.  Nursing note and vitals reviewed.   ED Course  Procedures (including critical care time) Labs Review Labs Reviewed - No data to display  Imaging Review Dg Chest 2 View  04/13/2015   CLINICAL DATA:  Cough  EXAM: CHEST  2 VIEW  COMPARISON:  04/18/2014  FINDINGS: The heart size and mediastinal contours are within normal limits. Both lungs are clear. The visualized skeletal structures are unremarkable.  IMPRESSION: No active cardiopulmonary disease.   Electronically Signed   By: Franchot Gallo M.D.   On: 04/13/2015 12:23     MDM   1. Acute bronchitis, unspecified organism    CXR normal.  Treat symptomatically with proair, cough suppressant, prednisone.  F/u PRN    Meds ordered this encounter  Medications  . predniSONE (DELTASONE) 50 MG tablet    Sig: Take 1 tablet (50 mg total) by mouth daily with breakfast.    Dispense:  5 tablet    Refill:  0    Order Specific Question:  Supervising Provider    Answer:  Billy Fischer [phone]  . albuterol (PROVENTIL HFA;VENTOLIN HFA) 108 (90 BASE) MCG/ACT inhaler    Sig: Inhale 2 puffs into the lungs every 4 (four) hours as needed for wheezing.    Dispense:  1 Inhaler    Refill:  0    Order Specific Question:  Supervising Provider    Answer:  Ihor Gully D [id number]  . brompheniramine-pseudoephedrine-DM 30-2-10 MG/5ML syrup    Sig: Take 10 mLs by mouth every 4 (four) hours as needed.    Dispense:  120 mL    Refill:  2    Order Specific Question:  Supervising Provider    Answer:  Ihor Gully D [place], PA-C 04/13/15 1228

## 2015-04-13 NOTE — ED Notes (Signed)
C/o cold sx onset Monday morning Sx include prod cough, sneezing, wheezing, runny nose Seen here on 3/27 and dx w/bronchitis  Alert, no signs of acute distress.

## 2015-04-13 NOTE — Discharge Instructions (Signed)

## 2016-02-15 ENCOUNTER — Encounter (HOSPITAL_COMMUNITY): Payer: Self-pay | Admitting: *Deleted

## 2016-02-15 DIAGNOSIS — K219 Gastro-esophageal reflux disease without esophagitis: Secondary | ICD-10-CM | POA: Insufficient documentation

## 2016-02-15 DIAGNOSIS — Y9389 Activity, other specified: Secondary | ICD-10-CM | POA: Insufficient documentation

## 2016-02-15 DIAGNOSIS — Y998 Other external cause status: Secondary | ICD-10-CM | POA: Insufficient documentation

## 2016-02-15 DIAGNOSIS — Z8709 Personal history of other diseases of the respiratory system: Secondary | ICD-10-CM | POA: Insufficient documentation

## 2016-02-15 DIAGNOSIS — Z7952 Long term (current) use of systemic steroids: Secondary | ICD-10-CM | POA: Insufficient documentation

## 2016-02-15 DIAGNOSIS — Z23 Encounter for immunization: Secondary | ICD-10-CM | POA: Insufficient documentation

## 2016-02-15 DIAGNOSIS — S61411A Laceration without foreign body of right hand, initial encounter: Secondary | ICD-10-CM | POA: Insufficient documentation

## 2016-02-15 DIAGNOSIS — Z79899 Other long term (current) drug therapy: Secondary | ICD-10-CM | POA: Insufficient documentation

## 2016-02-15 DIAGNOSIS — W228XXA Striking against or struck by other objects, initial encounter: Secondary | ICD-10-CM | POA: Insufficient documentation

## 2016-02-15 DIAGNOSIS — Z87891 Personal history of nicotine dependence: Secondary | ICD-10-CM | POA: Insufficient documentation

## 2016-02-15 DIAGNOSIS — S61206A Unspecified open wound of right little finger without damage to nail, initial encounter: Secondary | ICD-10-CM | POA: Insufficient documentation

## 2016-02-15 DIAGNOSIS — Y9289 Other specified places as the place of occurrence of the external cause: Secondary | ICD-10-CM | POA: Insufficient documentation

## 2016-02-15 NOTE — ED Notes (Signed)
Pt states that he punched through glass today at 1500. States that he had bandaged his cuts but when he took them off this afternoon his pinky finger on his right hand continued to bleed. Right hand appears swollen and is tender to touch.

## 2016-02-16 ENCOUNTER — Emergency Department (HOSPITAL_COMMUNITY): Payer: Self-pay

## 2016-02-16 ENCOUNTER — Emergency Department (HOSPITAL_COMMUNITY)
Admission: EM | Admit: 2016-02-16 | Discharge: 2016-02-16 | Disposition: A | Discharge status: Home / Self Care | Payer: Self-pay | Attending: Emergency Medicine | Admitting: Emergency Medicine

## 2016-02-16 DIAGNOSIS — S61209A Unspecified open wound of unspecified finger without damage to nail, initial encounter: Secondary | ICD-10-CM

## 2016-02-16 DIAGNOSIS — S61411A Laceration without foreign body of right hand, initial encounter: Secondary | ICD-10-CM

## 2016-02-16 MED ORDER — CEPHALEXIN 500 MG PO CAPS: 500.0000 mg | ORAL_CAPSULE | Freq: Four times a day (QID) | ORAL | Status: DC

## 2016-02-16 MED ORDER — TETANUS-DIPHTH-ACELL PERTUSSIS 5-2.5-18.5 LF-MCG/0.5 IM SUSP
0.5000 mL | Freq: Once | INTRAMUSCULAR | Status: AC
Start: 2016-02-16 — End: 2016-02-16
  Administered 2016-02-16: 0.5 mL via INTRAMUSCULAR
  Filled 2016-02-16: qty 0.5

## 2016-02-16 NOTE — Discharge Instructions (Signed)
Read the information below.  Use the prescribed medication as directed.  Please discuss all new medications with your pharmacist.  You may return to the Emergency Department at any time for worsening condition or any new symptoms that concern you.    If you develop redness, swelling, pus draining from the wound, or fevers greater than 100.4, return to the ER immediately for a recheck.     Deep Skin Avulsion A deep skin avulsion is a type of open wound. It often results from a severe injury (trauma) that tears away all layers of the skin or an entire body part. The areas of the body that are most often affected by a deep skin avulsion include the face, lips, ears, nose, and fingers. A deep skin avulsion may make structures below the skin become visible. You may be able to see muscle, bone, nerves, and blood vessels. A deep skin avulsion can also damage important structures beneath the skin. These include tendons, ligaments, nerves, or blood vessels. CAUSES Injuries that often cause a deep skin avulsion include:  Being crushed.  Falling against a jagged surface.  Animal bites.  Gunshot wounds.  Severe burns.  Injuries that involve being dragged, such as bicycle or motorcycle accidents. SYMPTOMS Symptoms of a deep skin avulsion include:  Pain.  Numbness.  Swelling.  A misshapen body part.  Bleeding, which may be heavy.  Fluid leaking from the wound. DIAGNOSIS This condition may be diagnosed with a medical history and physical exam. You may also have X-rays done. TREATMENT The treatment that is chosen for a deep skin avulsion depends on how large and deep the wound is and where it is located. Treatment for all types of avulsions usually starts with:  Controlling the bleeding.  Washing out the wound with a germ-free (sterile) salt-water solution.  Removing dead tissue from the wound. A wound may be closed or left open to heal. This depends on the size and location of the wound  and whether it is likely to become infected. Wounds are usually covered or closed if they expose blood vessels, nerves, bone, or cartilage.  Wounds that are small and clean may be closed with stitches (sutures).  Wounds that cannot be closed with sutures may be covered with a piece of skin (graft) or a skin flap. Skin may be taken from on or near the wound, from another part of the body, or from a donor.  Wounds may be left open if they are hard to close or they may become infected. These wounds heal over time from the bottom up. You may also receive medicine. This may include:  Antibiotics.  A tetanus shot.  Rabies vaccine. HOME CARE INSTRUCTIONS Medicines  Take or apply over-the-counter and prescription medicines only as told by your health care provider.  If you were prescribed an antibiotic, take or apply it as told by your health care provider. Do not stop taking the antibiotic even if your condition improves.  You may get anti-itch medicine while your wound is healing. Use it only as told by your health care provider. Wound Care  There are many ways to close and cover a wound. For example, a wound can be covered with sutures, skin glue, or adhesive strips. Follow instructions from your health care provider about:  How to take care of your wound.  When and how you should change your bandage (dressing).  When you should remove your dressing.  Removing whatever was used to close your wound.  Keep  the dressing dry as told by your health care provider. Do not take baths, swim, use a hot tub, or do anything that would put your wound underwater until your health care provider approves.  Clean the wound each day or as told by your health care provider.  Wash the wound with mild soap and water.  Rinse the wound with water to remove all soap.  Pat the wound dry with a clean towel. Do not rub it.  Do not scratch or pick at the wound.  Check your wound every day for signs of  infection. Watch for:  Redness, swelling, or pain.  Fluid, blood, or pus. General Instructions  Raise (elevate) the injured area above the level of your heart while you are sitting or lying down.  Keep all follow-up visits as told by your health care provider. This is important. SEEK MEDICAL CARE IF:  You received a tetanus shot and you have swelling, severe pain, redness, or bleeding at the injection site.  You have a fever.  Your pain is not controlled with medicine.  You have increased redness, swelling, or pain at the site of your wound.  You have fluid, blood, or pus coming from your wound.  You notice a bad smell coming from your wound or your dressing.  A wound that was closed breaks open.  You notice something coming out of the wound, such as wood or glass.  You notice a change in the color of your skin near your wound.  You develop a new rash.  You need to change the dressing frequently due to fluid, blood, or pus draining from the wound. SEEK IMMEDIATE MEDICAL CARE IF:  Your pain suddenly increases and is severe.  You develop severe swelling around the wound.  You develop numbness around the wound.  You have nausea and vomiting that does not go away after 24 hours.  You feel light-headed, weak, or faint.  You develop chest pain.  You have trouble breathing.  Your wound is on your hand or foot and you cannot properly move a finger or toe.  The wound is on your hand or foot and you notice that your fingers or toes look pale or bluish.  You have a red streak going away from your wound.   This information is not intended to replace advice given to you by your health care provider. Make sure you discuss any questions you have with your health care provider.   Document Released: 02/03/2007 Document Revised: 04/24/2015 Document Reviewed: 12/13/2014 Elsevier Interactive Patient Education 2016 [employer]  Nonsutured Laceration Care A laceration is a  cut that goes through all layers of the skin and extends into the tissue that is right under the skin. This type of cut is usually stitched up (sutured) or closed with tape (adhesive strips) or skin glue shortly after the injury happens. However, if the wound is dirty or if several hours pass before medical treatment is provided, it is likely that germs (bacteria) will enter the wound. Closing a laceration after bacteria have entered it increases the risk of infection. In these cases, your health care provider may leave the laceration open (nonsutured) and cover it with a bandage. This type of treatment helps prevent infection and allows the wound to heal from the deepest layer of tissue damage up to the surface. An open fracture is a type of injury that may involve nonsutured lacerations. An open fracture is a break in a bone that happens along with  one or more lacerations through the skin that is near the fracture site. HOW TO CARE FOR YOUR NONSUTURED LACERATION  Take or apply over-the-counter and prescription medicines only as told by your health care provider.  If you were prescribed an antibiotic medicine, take or apply it as told by your health care provider. Do not stop using the antibiotic even if your condition improves.  Clean the wound one time each day or as told by your health care provider.  Wash the wound with mild soap and water.  Rinse the wound with water to remove all soap.  Pat your wound dry with a clean towel. Do not rub the wound.  Do not inject anything into the wound unless your health care provider told you to.  Change any bandages (dressings) as told by your health care provider. This includes changing the dressing if it gets wet, dirty, or starts to smell bad.  Keep the dressing dry until your health care provider says it can be removed. Do not take baths, swim, or do anything that puts your wound underwater until your health care provider approves.  Raise (elevate)  the injured area above the level of your heart while you are sitting or lying down, if possible.  Do not scratch or pick at the wound.  Check your wound every day for signs of infection. Watch for:  Redness, swelling, or pain.  Fluid, blood, or pus.  Keep all follow-up visits as told by your health care provider. This is important. SEEK MEDICAL CARE IF:  You received a tetanus and shot and you have swelling, severe pain, redness, or bleeding at the injection site.   You have a fever.  Your pain is not controlled with medicine.  You have increased redness, swelling, or pain at the site of your wound.  You have fluid, blood, or pus coming from your wound.  You notice a bad smell coming from your wound or your dressing.  You notice something coming out of the wound, such as wood or glass.  You notice a change in the color of your skin near your wound.  You develop a new rash.  You need to change the dressing frequently due to fluid, blood, or pus draining from the wound.  You develop numbness around your wound. SEEK IMMEDIATE MEDICAL CARE IF:  Your pain suddenly increases and is severe.  You develop severe swelling around the wound.  The wound is on your hand or foot and you cannot properly move a finger or toe.  The wound is on your hand or foot and you notice that your fingers or toes look pale or bluish.  You have a red streak going away from your wound.   This information is not intended to replace advice given to you by your health care provider. Make sure you discuss any questions you have with your health care provider.   Document Released: 11/05/2006 Document Revised: 04/24/2015 Document Reviewed: 12/04/2014 Elsevier Interactive Patient Education [employer].

## 2016-02-16 NOTE — ED Provider Notes (Signed)
CSN: [id number]     Arrival date & time 02/15/16  2315 History   First MD Initiated Contact with Patient 02/16/16 0048     Chief Complaint  Patient presents with  . Extremity Laceration     (Consider location/radiation/quality/duration/timing/severity/associated sxs/prior Treatment) The history is provided by the patient.     Right handed pt presents with right hand injury after getting angry and punching through a mirror this afternoon between 2:00-3:00pm.  States he has gotten all of his cuts to stop bleeding with exception of the wound on his right 5th finger.  He is unsure of last Tdap. Denies other injury.  Denies weakness or numbness of the finger.    Past Medical History  Diagnosis Date  . Bronchitis   . Frequent sinus infections   . GERD (gastroesophageal reflux disease)    History reviewed. No pertinent past surgical history. Family History  Problem Relation Age of Onset  . Diabetes Mother   . Asthma Other    Social History  Substance Use Topics  . Smoking status: Former Research scientist (life sciences)  . Smokeless tobacco: None  . Alcohol Use: Yes     Comment: occasional    Review of Systems  Constitutional: Negative for fever and chills.  Musculoskeletal: Positive for arthralgias.  Skin: Positive for wound. Negative for color change, pallor and rash.  Allergic/Immunologic: Negative for immunocompromised state.  Neurological: Negative for weakness and numbness.  Hematological: Does not bruise/bleed easily.  Psychiatric/Behavioral: Positive for self-injury.      Allergies  Review of patient's allergies indicates no known allergies.  Home Medications   Prior to Admission medications   Medication Sig Start Date End Date Taking? Authorizing Provider  albuterol (PROVENTIL HFA;VENTOLIN HFA) 108 (90 BASE) MCG/ACT inhaler Inhale 2 puffs into the lungs every 4 (four) hours as needed for wheezing. 04/13/15   Liam Graham, PA-C  brompheniramine-pseudoephedrine-DM 30-2-10 MG/5ML syrup  Take 10 mLs by mouth every 4 (four) hours as needed. 04/13/15   Liam Graham, PA-C  calcium carbonate (TUMS - DOSED IN MG ELEMENTAL CALCIUM) 500 MG chewable tablet Chew 1 tablet by mouth daily as needed for indigestion or heartburn.    Historical Provider, MD  chlorpheniramine-HYDROcodone (TUSSIONEX PENNKINETIC ER) 10-8 MG/5ML LQCR Take 5 mLs by mouth every 12 (twelve) hours as needed for cough. 03/18/15   Liam Graham, PA-C  ibuprofen (ADVIL,MOTRIN) 200 MG tablet Take 400 mg by mouth every 6 (six) hours as needed for fever, headache or mild pain.    Historical Provider, MD  predniSONE (DELTASONE) 50 MG tablet Take 1 tablet (50 mg total) by mouth daily with breakfast. 04/13/15   Liam Graham, PA-C  traMADol (ULTRAM) 50 MG tablet Take 1 tablet (50 mg total) by mouth every 6 (six) hours as needed. 07/21/14   Kaitlyn Szekalski, PA-C   BP 127/82 mmHg  Pulse 93  Temp(Src) 98.6 F (37 C) (Oral)  Resp 20  Ht 5\' 11"  (1.803 m)  Wt 106.595 kg  BMI 32.79 kg/m2  SpO2 99% Physical Exam  Constitutional: He appears well-developed and well-nourished. No distress.  HENT:  Head: Normocephalic and atraumatic.  Neck: Neck supple.  Pulmonary/Chest: Effort normal.  Musculoskeletal:  Right hand with tiny lacerations over thenar eminence and volar wrist, hemostatic.  Skin avulsion over ulnar aspect 5th finger over PIP, bleeding.  Full active range of motion of all digits, strength 5/5, sensation intact, capillary refill < 2 seconds.   Neurological: He is alert.  Skin: He is not diaphoretic.  Nursing note and vitals reviewed.   ED Course  Procedures (including critical care time) Labs Review Labs Reviewed - No data to display  Imaging Review Dg Hand Complete Right  02/16/2016  CLINICAL DATA:  Laceration to the right fifth finger, after punching hand through glass. Swelling across the palm. Initial encounter. EXAM: RIGHT HAND - COMPLETE 3+ VIEW COMPARISON:  None. FINDINGS: There is no evidence of  fracture or dislocation. Mild negative ulnar variance is noted. There is mild chronic deformity of the distal fifth metacarpal. The joint spaces are preserved. The carpal rows are intact, and demonstrate normal alignment. Soft tissue disruption is noted at the fifth finger. No radiopaque foreign bodies are seen. IMPRESSION: No evidence of fracture or dislocation. Soft tissue disruption at the fifth finger. Electronically Signed   By: Garald Balding M.D.   On: 02/16/2016 01:04   I have personally reviewed and evaluated these images and lab results as part of my medical decision-making.   EKG Interpretation None      MDM   Final diagnoses:  Avulsion of skin of finger, initial encounter  Laceration of hand with delay in treatment, right, initial encounter    Afebrile, nontoxic patient with injury to right hand after punching a mirror.  Xray negative for fracture or retained FB.  Wound thoroughly cleansed.  Dressed using surgifoam and pressure dressing.  Pt advised to monitor and discussed reasons to remove dressing and return precautions.  Other wounds dressed appropriately.  The wounds will do well without repair - there has been a time delay beyond what is typically deemed safe to close the wound but they are also small and well approximated.  Avulsed segment is without possibilty of suture repair - dressed as described above.   D/C home with keflex, return precautions.  Discussed result, findings, treatment, and follow up  with patient.  Pt given return precautions.  Pt verbalizes understanding and agrees with plan.         Clayton Bibles, PA-C [id number]  Delora Fuel, MD [id number]

## 2016-02-16 NOTE — ED Notes (Signed)
Pt is in radiology.

## 2016-03-03 ENCOUNTER — Emergency Department (HOSPITAL_COMMUNITY)
Admission: EM | Admit: 2016-03-03 | Discharge: 2016-03-03 | Disposition: A | Discharge status: Home / Self Care | Payer: Self-pay | Attending: Emergency Medicine | Admitting: Emergency Medicine

## 2016-03-03 ENCOUNTER — Encounter (HOSPITAL_COMMUNITY): Payer: Self-pay | Admitting: Emergency Medicine

## 2016-03-03 DIAGNOSIS — Z792 Long term (current) use of antibiotics: Secondary | ICD-10-CM | POA: Insufficient documentation

## 2016-03-03 DIAGNOSIS — Z8719 Personal history of other diseases of the digestive system: Secondary | ICD-10-CM | POA: Insufficient documentation

## 2016-03-03 DIAGNOSIS — R69 Illness, unspecified: Secondary | ICD-10-CM

## 2016-03-03 DIAGNOSIS — Z79899 Other long term (current) drug therapy: Secondary | ICD-10-CM | POA: Insufficient documentation

## 2016-03-03 DIAGNOSIS — J111 Influenza due to unidentified influenza virus with other respiratory manifestations: Secondary | ICD-10-CM | POA: Insufficient documentation

## 2016-03-03 DIAGNOSIS — R000 Tachycardia, unspecified: Secondary | ICD-10-CM | POA: Insufficient documentation

## 2016-03-03 DIAGNOSIS — R112 Nausea with vomiting, unspecified: Secondary | ICD-10-CM | POA: Insufficient documentation

## 2016-03-03 DIAGNOSIS — Z87891 Personal history of nicotine dependence: Secondary | ICD-10-CM | POA: Insufficient documentation

## 2016-03-03 LAB — RAPID STREP SCREEN (MED CTR MEBANE ONLY): Streptococcus, Group A Screen (Direct): NEGATIVE

## 2016-03-03 MED ORDER — SODIUM CHLORIDE 0.9 % IV BOLUS (SEPSIS)
1000.0000 mL | Freq: Once | INTRAVENOUS | Status: AC
  Administered 2016-03-03: 1000 mL via INTRAVENOUS

## 2016-03-03 MED ORDER — ONDANSETRON 4 MG PO TBDP
4.0000 mg | ORAL_TABLET | Freq: Once | ORAL | Status: AC
  Administered 2016-03-03: 4 mg via ORAL

## 2016-03-03 MED ORDER — ACETAMINOPHEN 325 MG PO TABS
ORAL_TABLET | ORAL | Status: AC
  Filled 2016-03-03: qty 1

## 2016-03-03 MED ORDER — ONDANSETRON 4 MG PO TBDP
ORAL_TABLET | ORAL | Status: AC
  Filled 2016-03-03: qty 1

## 2016-03-03 MED ORDER — HYDROCOD POLST-CPM POLST ER 10-8 MG/5ML PO SUER: 5.0000 mL | Freq: Two times a day (BID) | ORAL | Status: AC | PRN

## 2016-03-03 MED ORDER — ACETAMINOPHEN 325 MG PO TABS
650.0000 mg | ORAL_TABLET | Freq: Once | ORAL | Status: AC | PRN
  Administered 2016-03-03: 650 mg via ORAL

## 2016-03-03 MED ORDER — ACETAMINOPHEN 325 MG PO TABS
ORAL_TABLET | ORAL | Status: AC
  Filled 2016-03-03: qty 2

## 2016-03-03 MED ORDER — KETOROLAC TROMETHAMINE 30 MG/ML IJ SOLN
30.0000 mg | Freq: Once | INTRAMUSCULAR | Status: AC
  Administered 2016-03-03: 30 mg via INTRAVENOUS
  Filled 2016-03-03: qty 1

## 2016-03-03 NOTE — Discharge Instructions (Signed)
Please read and follow all provided instructions.  Your diagnoses today include:  1. Influenza-like illness    Tests performed today include:  Vital signs. See below for your results today.   Strep test - negative  Medications prescribed:   Tussinex - narcotic cough suppressant syrup  You have been prescribed narcotic cough suppressant such as Tussinex: DO NOT drive or perform any activities that require you to be awake and alert because this medicine can make you drowsy.   Take any prescribed medications only as directed.  Home care instructions:  Follow any educational materials contained in this packet. Please continue drinking plenty of fluids. Use over-the-counter cold and flu medications as needed as directed on packaging for symptom relief. You may also use ibuprofen or tylenol as directed on packaging for pain or fever.   BE VERY CAREFUL not to take multiple medicines containing Tylenol (also called acetaminophen). Doing so can lead to an overdose which can damage your liver and cause liver failure and possibly death.   Follow-up instructions: Please follow-up with your primary care provider in the next 3 days for further evaluation of your symptoms.   Return instructions:   Please return to the Emergency Department if you experience worsening symptoms.  Please return if you have a high fever greater than 101 degrees not controlled with over-the-counter medications, persistent vomiting and cannot keep down fluids, or worsening trouble breathing.  Please return if you have any other emergent concerns.  Additional Information:  Your vital signs today were: BP 114/79 mmHg   Pulse 113   Temp(Src) 99.3 F (37.4 C) (Oral)   Resp 18   Ht 5\' 11"  (1.803 m)   Wt 107.956 kg   BMI 33.21 kg/m2   SpO2 96% If your blood pressure (BP) was elevated above 135/85 this visit, please have this repeated by your doctor within one month.

## 2016-03-03 NOTE — ED Notes (Signed)
Pt is in stable condition upon d/c and ambulates from ED. 

## 2016-03-03 NOTE — ED Provider Notes (Signed)
CSN: [id number]     Arrival date & time 03/03/16  0857 History   First MD Initiated Contact with Patient 03/03/16 1045     Chief Complaint  Patient presents with  . Sore Throat  . Cough     (Consider location/radiation/quality/duration/timing/severity/associated sxs/prior Treatment) HPI Comments: Patient presents with three-day history of fever, cough, sore throat, headache, body aches. Patient states that he recently traveled to [place]. Patient has had vomiting since arrival to the emergency department. Patient took over-the-counter medications prior to coming in today and thinks it's related to this. No vomiting prior to ED visit. No chest pain or shortness of breath. No abdominal pain, diarrhea or vomiting. No urinary symptoms. No known sick contacts. Patient did not have flu shot this year. Onset of symptoms acute. Course is constant. Nothing makes symptoms better or worse.  Patient is a 35 y.o. male presenting with pharyngitis and cough. The history is provided by the patient.  Sore Throat Associated symptoms include chills, coughing, fatigue, a fever, headaches, myalgias, nausea, a sore throat and vomiting. Pertinent negatives include no abdominal pain, congestion or rash.  Cough Associated symptoms: chills, fever, headaches, myalgias and sore throat   Associated symptoms: no ear pain, no rash, no rhinorrhea and no wheezing     Past Medical History  Diagnosis Date  . Bronchitis   . Frequent sinus infections   . GERD (gastroesophageal reflux disease)    History reviewed. No pertinent past surgical history. Family History  Problem Relation Age of Onset  . Diabetes Mother   . Asthma Other    Social History  Substance Use Topics  . Smoking status: Former Research scientist (life sciences)  . Smokeless tobacco: None  . Alcohol Use: Yes     Comment: occasional    Review of Systems  Constitutional: Positive for fever, chills and fatigue.  HENT: Positive for sore throat. Negative for congestion, ear  pain, rhinorrhea and sinus pressure.   Eyes: Negative for redness.  Respiratory: Positive for cough. Negative for wheezing.   Gastrointestinal: Positive for nausea and vomiting. Negative for abdominal pain and diarrhea.  Genitourinary: Negative for dysuria.  Musculoskeletal: Positive for myalgias. Negative for neck stiffness.  Skin: Negative for rash.  Neurological: Positive for headaches.  Hematological: Negative for adenopathy.  Psychiatric/Behavioral: Negative for confusion.      Allergies  Review of patient's allergies indicates no known allergies.  Home Medications   Prior to Admission medications   Medication Sig Start Date End Date Taking? Authorizing Provider  albuterol (PROVENTIL HFA;VENTOLIN HFA) 108 (90 BASE) MCG/ACT inhaler Inhale 2 puffs into the lungs every 4 (four) hours as needed for wheezing. 04/13/15   Liam Graham, PA-C  brompheniramine-pseudoephedrine-DM 30-2-10 MG/5ML syrup Take 10 mLs by mouth every 4 (four) hours as needed. 04/13/15   Liam Graham, PA-C  calcium carbonate (TUMS - DOSED IN MG ELEMENTAL CALCIUM) 500 MG chewable tablet Chew 1 tablet by mouth daily as needed for indigestion or heartburn.    Historical Provider, MD  cephALEXin (KEFLEX) 500 MG capsule Take 1 capsule (500 mg total) by mouth 4 (four) times daily. 02/16/16   Clayton Bibles, PA-C  chlorpheniramine-HYDROcodone (TUSSIONEX PENNKINETIC ER) 10-8 MG/5ML LQCR Take 5 mLs by mouth every 12 (twelve) hours as needed for cough. 03/18/15   Liam Graham, PA-C  ibuprofen (ADVIL,MOTRIN) 200 MG tablet Take 400 mg by mouth every 6 (six) hours as needed for fever, headache or mild pain.    Historical Provider, MD  predniSONE (DELTASONE) 50 MG tablet  Take 1 tablet (50 mg total) by mouth daily with breakfast. 04/13/15   Liam Graham, PA-C  traMADol (ULTRAM) 50 MG tablet Take 1 tablet (50 mg total) by mouth every 6 (six) hours as needed. 07/21/14   Kaitlyn Szekalski, PA-C   BP 109/78 mmHg  Pulse 105   Temp(Src) 100 F (37.8 C) (Oral)  Resp 18  Ht 5\' 11"  (1.803 m)  Wt 107.956 kg  BMI 33.21 kg/m2  SpO2 96%   Physical Exam  Constitutional: He appears well-developed and well-nourished.  HENT:  Head: Normocephalic and atraumatic.  Right Ear: Tympanic membrane, external ear and ear canal normal.  Left Ear: Tympanic membrane, external ear and ear canal normal.  Nose: Nose normal. No mucosal edema or rhinorrhea.  Mouth/Throat: Uvula is midline and mucous membranes are normal. Mucous membranes are not dry. No trismus in the jaw. No uvula swelling. Posterior oropharyngeal erythema present. No oropharyngeal exudate, posterior oropharyngeal edema or tonsillar abscesses.  Eyes: Conjunctivae are normal. Right eye exhibits no discharge. Left eye exhibits no discharge.  Neck: Normal range of motion. Neck supple.  Cardiovascular: Regular rhythm and normal heart sounds.  Tachycardia present.   No murmur heard. Pulmonary/Chest: Effort normal and breath sounds normal. No respiratory distress. He has no wheezes. He has no rales.  Abdominal: Soft. There is no tenderness.  Lymphadenopathy:    He has no cervical adenopathy.  Neurological: He is alert.  Skin: Skin is warm and dry.  Psychiatric: He has a normal mood and affect.  Nursing note and vitals reviewed.   ED Course  Procedures (including critical care time) Labs Review Labs Reviewed  RAPID STREP SCREEN (NOT AT [facility])  CULTURE, GROUP A STREP [facility])    Imaging Review No results found. I have personally reviewed and evaluated these images and lab results as part of my medical decision-making.   EKG Interpretation None      11:50 AM Patient seen and examined. Given vomiting, fever, tachycardia -- will give bolus of IV fluids, IV Toradol. Patient appears well, nontoxic. Informed of negative strep test results.  Vital signs reviewed and are as follows: BP 109/78 mmHg  Pulse 105  Temp(Src) 100 F (37.8 C) (Oral)  Resp 18  Ht 5\' 11"   (1.803 m)  Wt 107.956 kg  BMI 33.21 kg/m2  SpO2 96%  1:20 PM Patient has received Toradol and IV fluids. Heart rate has decreased from 120 when I first saw him to 105.  Patient discharged to home. Encouraged to rest and drink plenty of fluids.  Patient told to return to ED or see their primary doctor if their symptoms worsen, high fever not controlled with tylenol, persistent vomiting, they feel they are dehydrated, or if they have any other concerns.  Patient verbalized understanding and agreed with plan.    Patient counseled on use of narcotic cough medications. Counseled not to combine these medications with others containing tylenol. Urged not to drink alcohol, drive, or perform any other activities that requires focus while taking these medications. The patient verbalizes understanding and agrees with the plan.   MDM   Final diagnoses:  Influenza-like illness   Patient with symptoms consistent with influenza. Vitals are stable, low-grade fever. Vomiting upon arrival to the emergency department, now tolerating oral fluids. Lungs are clear, doubt pneumonia. No hypoxia. Supportive therapy indicated with return if symptoms worsen.     Carlisle Cater, PA-C 03/03/16 1322  Julianne Rice, MD 03/12/16 [phone]

## 2016-03-03 NOTE — ED Notes (Signed)
Pt c/o cough and cold symptoms x 3 days. Pt c/o sore throat. Pt denies vomiting or diarrhea, reports able to drink fluids.

## 2016-03-03 NOTE — ED Notes (Signed)
Pt began vomiting in triage

## 2016-03-05 LAB — CULTURE, GROUP A STREP (THRC)

## 2016-04-01 ENCOUNTER — Encounter: Payer: Self-pay | Admitting: Internal Medicine

## 2016-04-01 ENCOUNTER — Ambulatory Visit: Payer: Self-pay | Attending: Internal Medicine | Admitting: Internal Medicine

## 2016-04-01 VITALS — BP 120/90 | HR 84 | Temp 98.8°F | Ht 72.0 in | Wt 246.0 lb

## 2016-04-01 DIAGNOSIS — K219 Gastro-esophageal reflux disease without esophagitis: Secondary | ICD-10-CM

## 2016-04-01 DIAGNOSIS — J302 Other seasonal allergic rhinitis: Secondary | ICD-10-CM

## 2016-04-01 DIAGNOSIS — E669 Obesity, unspecified: Secondary | ICD-10-CM

## 2016-04-01 MED ORDER — LORATADINE 10 MG PO TABS: 10.0000 mg | ORAL_TABLET | Freq: Every day | ORAL | Status: DC

## 2016-04-01 MED ORDER — FLUTICASONE PROPIONATE 50 MCG/ACT NA SUSP: 2.0000 | Freq: Every day | NASAL | Status: DC

## 2016-04-01 MED ORDER — PANTOPRAZOLE SODIUM 40 MG PO TBEC: 40.0000 mg | DELAYED_RELEASE_TABLET | Freq: Every day | ORAL | Status: DC

## 2016-04-01 MED FILL — ?PANTOPRAZOLE SOD DR 40MG: 40 MG | 30 days supply | Qty: 30 | Fill #0

## 2016-04-01 MED FILL — FLUTICASONE PROP 50 MCG SPR: 50 | 30 days supply | Qty: 16 | Fill #0

## 2016-04-01 NOTE — Progress Notes (Signed)
Derrick Greene, is a 35 y.o. male  [id number]  [id number]  DOB - [birth year]  CC:  Chief Complaint  Patient presents with  . Annual Exam       HPI: Derrick Greene is a 35 y.o. male here today to establish medical care.  Last seen in our clinic 5/15, with a few ER visits since.  Per pt, getting over "flu" 1 month ago. Since than c/o of "sinus" bothering him, w/ congestion, dry cough, but w/ intermittant white phlegm production.  C/o of severe acid reflux as well, pepcid helped him at 1 time, but stopped taking once he changed his diet some. He is now not eating after 7pm which has helped his reflux a lot, he takes an occasional TUMS as well.  Of note, he use to be on flonase nasal spray, but stopped due to cost.  Wants to get a check up so he can start exercising again, he hasn't exercise in while, and thus gained some weight since.   Patient has No headache, No chest pain, No abdominal pain - No Nausea, No new weakness tingling or numbness, No Cough - SOB.  No Known Allergies Past Medical History  Diagnosis Date  . Bronchitis   . Frequent sinus infections   . GERD (gastroesophageal reflux disease)    Current Outpatient Prescriptions on File Prior to Visit  Medication Sig Dispense Refill  . acetaminophen (TYLENOL) 500 MG tablet Take 1,000 mg by mouth every 6 (six) hours as needed for fever.    . calcium carbonate (TUMS - DOSED IN MG ELEMENTAL CALCIUM) 500 MG chewable tablet Chew 1 tablet by mouth daily.    Marland Kitchen ibuprofen (ADVIL,MOTRIN) 800 MG tablet Take 800 mg by mouth every 6 (six) hours as needed for mild pain.     Marland Kitchen albuterol (PROVENTIL HFA;VENTOLIN HFA) 108 (90 BASE) MCG/ACT inhaler Inhale 2 puffs into the lungs every 4 (four) hours as needed for wheezing. (Patient not taking: Reported on 04/01/2016) 1 Inhaler 0  . chlorpheniramine-HYDROcodone (TUSSIONEX PENNKINETIC ER) 10-8 MG/5ML SUER Take 5 mLs by mouth every 12 (twelve) hours as needed for cough. (Patient not taking: Reported  on 04/01/2016) 140 mL 0  . ibuprofen (CHILDRENS MOTRIN) 100 MG/5ML suspension Take 200 mg by mouth every 4 (four) hours as needed for mild pain. Reported on 04/01/2016    . Phenylephrine-DM-GG-APAP (TYLENOL COLD/FLU SEVERE PO) Take 2 tablets by mouth every 4 (four) hours. Reported on 04/01/2016     No current facility-administered medications on file prior to visit.   Family History  Problem Relation Age of Onset  . Diabetes Mother   . Asthma Other    Social History   Social History  . Marital Status: Divorced    Spouse Name: N/A  . Number of Children: N/A  . Years of Education: N/A   Occupational History  . Not on file.   Social History Main Topics  . Smoking status: Former Research scientist (life sciences)  . Smokeless tobacco: Not on file  . Alcohol Use: Yes     Comment: occasional  . Drug Use: No  . Sexual Activity: Not on file   Other Topics Concern  . Not on file   Social History Narrative    Review of Systems: Constitutional: Negative for fever, chills, diaphoresis, activity change, appetite change and fatigue.  +weight gain/ HENT: Negative for ear pain, nosebleeds, congestion, facial swelling, rhinorrhea, neck pain, neck stiffness and ear discharge.  Eyes: Negative for pain, discharge, redness, itching and visual disturbance. Respiratory:  Negative for cough, choking, chest tightness, shortness of breath, wheezing and stridor.   +sinus congestion, cough, sputum production (clear) Cardiovascular: Negative for chest pain, palpitations and leg swelling. Gastrointestinal: Negative for abdominal distention. Genitourinary: Negative for dysuria, urgency, frequency, hematuria, flank pain, decreased urine volume, difficulty urinating and dyspareunia.  Musculoskeletal: Negative for back pain, joint swelling, arthralgia and gait problem. Neurological: Negative for dizziness, tremors, seizures, syncope, facial asymmetry, speech difficulty, weakness, light-headedness, numbness and headaches.    Hematological: Negative for adenopathy. Does not bruise/bleed easily. Psychiatric/Behavioral: Negative for hallucinations, behavioral problems, confusion, dysphoric mood, decreased concentration and agitation.    Objective:   Filed Vitals:   04/01/16 1500  BP: 120/90  Pulse: 84  Temp: 98.8 F (37.1 C)     Physical Exam: Constitutional: Patient appears well-developed and well-nourished. No distress. AAOx3, obese HENT: Normocephalic, atraumatic, External right and left ear normal. Bilateral ear effusions noted, clear.  Oropharynx is clear and moist.  Boggy naires, no ttp in facial/max sinuses.   No cobblestoning in OP; no  enlarged /exudated noted on tonsils. Eyes: Conjunctivae and EOM are normal. PERRL, no scleral icterus.  Neck: Normal ROM. Neck supple. No JVD. No tracheal deviation. No thyromegaly. CVS: RRR, S1/S2 +, no murmurs, no gallops, no carotid bruit.  Pulmonary: Effort and breath sounds normal, no stridor, rhonchi, wheezes, rales.  Abdominal: Soft.  Obese, BS +, no distension, tenderness, rebound or guarding.  Musculoskeletal: Normal range of motion. No edema and no tenderness.  Lymphadenopathy: No lymphadenopathy noted, cervical, inguinal or axillary Neuro: Alert. , muscle tone coordination. No cranial nerve deficit grossly. Skin: Skin is warm and dry. No rash noted. Not diaphoretic. No erythema. No pallor. Psychiatric: Normal mood and affect. Behavior, judgment, thought content normal.  Lab Results  Component Value Date   WBC 6.5 04/18/2014   HGB 14.6 04/18/2014   HCT 41.9 04/18/2014   MCV 84.6 04/18/2014   PLT 234 04/18/2014   Lab Results  Component Value Date   CREATININE 1.11 04/18/2014   BUN 17 04/18/2014   NA 140 04/18/2014   K 4.0 04/18/2014   CL 102 04/18/2014   CO2 26 04/18/2014    No results found for: HGBA1C Lipid Panel  No results found for: CHOL, TRIG, HDL, CHOLHDL, VLDL, LDLCALC     Blood Culture    Component Value Date/Time   SDES  THROAT 03/03/2016 1122   SPECREQUEST NONE Reflexed from [id number] 03/03/2016 1122   CULT NO GROUP A STREP (S.PYOGENES) ISOLATED 03/03/2016 1122   REPTSTATUS 03/05/2016 FINAL 03/03/2016 1122      Assessment and plan:   1. Seasonal allergic rhinitis Trial claritin and flonase nasal spray  2. Gastroesophageal reflux disease without esophagitis - may also be causing his coughing too. - trial ppi/protonix  3. Obesity - low salt diet recd, increase activity gradually since he hasn't exercise in while - am fasting labs:  - CBC with Differential; Future - Basic Metabolic Panel; Future - Lipid Panel; Future   Fu 2-3 months   The patient was given clear instructions to go to ER or return to medical center if symptoms don't improve, worsen or new problems develop. The patient verbalized understanding. The patient was told to call to get lab results if they haven't heard anything in the next week.      Maren Reamer, MD, [place], [place]   04/01/2016, 3:33 PM

## 2016-04-01 NOTE — Patient Instructions (Signed)
Exercising to Lose Weight Exercising can help you to lose weight. In order to lose weight through exercise, you need to do vigorous-intensity exercise. You can tell that you are exercising with vigorous intensity if you are breathing very hard and fast and cannot hold a conversation while exercising. Moderate-intensity exercise helps to maintain your current weight. You can tell that you are exercising at a moderate level if you have a higher heart rate and faster breathing, but you are still able to hold a conversation. HOW OFTEN SHOULD I EXERCISE? Choose an activity that you enjoy and set realistic goals. Your health care provider can help you to make an activity plan that works for you. Exercise regularly as directed by your health care provider. This may include:  Doing resistance training twice each week, such as:  Push-ups.  Sit-ups.  Lifting weights.  Using resistance bands.  Doing a given intensity of exercise for a given amount of time. Choose from these options:  150 minutes of moderate-intensity exercise every week.  75 minutes of vigorous-intensity exercise every week.  A mix of moderate-intensity and vigorous-intensity exercise every week. Children, pregnant women, people who are out of shape, people who are overweight, and older adults may need to consult a health care provider for individual recommendations. If you have any sort of medical condition, be sure to consult your health care provider before starting a new exercise program. WHAT ARE SOME ACTIVITIES THAT CAN HELP ME TO LOSE WEIGHT?   Walking at a rate of at least 4.5 miles an hour.  Jogging or running at a rate of 5 miles per hour.  Biking at a rate of at least 10 miles per hour.  Lap swimming.  Roller-skating or in-line skating.  Cross-country skiing.  Vigorous competitive sports, such as football, basketball, and soccer.  Jumping rope.  Aerobic dancing. HOW CAN I BE MORE ACTIVE IN MY DAY-TO-DAY  ACTIVITIES?  Use the stairs instead of the elevator.  Take a walk during your lunch break.  If you drive, park your car farther away from work or school.  If you take public transportation, get off one stop early and walk the rest of the way.  Make all of your phone calls while standing up and walking around.  Get up, stretch, and walk around every 30 minutes throughout the day. WHAT GUIDELINES SHOULD I FOLLOW WHILE EXERCISING?  Do not exercise so much that you hurt yourself, feel dizzy, or get very short of breath.  Consult your health care provider prior to starting a new exercise program.  Wear comfortable clothes and shoes with good support.  Drink plenty of water while you exercise to prevent dehydration or heat stroke. Body water is lost during exercise and must be replaced.  Work out until you breathe faster and your heart beats faster.   This information is not intended to replace advice given to you by your health care provider. Make sure you discuss any questions you have with your health care provider.   Document Released: 01/10/2011 Document Revised: 12/29/2014 Document Reviewed: 05/11/2014 Elsevier Interactive Patient Education 2016 Elsevier Inc.   Low-Sodium Eating Plan Sodium raises blood pressure and causes water to be held in the body. Getting less sodium from food will help lower your blood pressure, reduce any swelling, and protect your heart, liver, and kidneys. We get sodium by adding salt (sodium chloride) to food. Most of our sodium comes from canned, boxed, and frozen foods. Restaurant foods, fast foods, and pizza are  also very high in sodium. Even if you take medicine to lower your blood pressure or to reduce fluid in your body, getting less sodium from your food is important. WHAT IS MY PLAN? Most people should limit their sodium intake to 2,300 mg a day. Your health care provider recommends that you limit your sodium intake to __________ a day.  WHAT DO  I NEED TO KNOW ABOUT THIS EATING PLAN? For the low-sodium eating plan, you will follow these general guidelines:  Choose foods with a % Daily Value for sodium of less than 5% (as listed on the food label).   Use salt-free seasonings or herbs instead of table salt or sea salt.   Check with your health care provider or pharmacist before using salt substitutes.   Eat fresh foods.  Eat more vegetables and fruits.  Limit canned vegetables. If you do use them, rinse them well to decrease the sodium.   Limit cheese to 1 oz (28 g) per day.   Eat lower-sodium products, often labeled as "lower sodium" or "no salt added."  Avoid foods that contain monosodium glutamate (MSG). MSG is sometimes added to [place] food and some canned foods.  Check food labels (Nutrition Facts labels) on foods to learn how much sodium is in one serving.  Eat more home-cooked food and less restaurant, buffet, and fast food.  When eating at a restaurant, ask that your food be prepared with less salt, or no salt if possible.  HOW DO I READ FOOD LABELS FOR SODIUM INFORMATION? The Nutrition Facts label lists the amount of sodium in one serving of the food. If you eat more than one serving, you must multiply the listed amount of sodium by the number of servings. Food labels may also identify foods as:  Sodium free--Less than 5 mg in a serving.  Very low sodium--35 mg or less in a serving.  Low sodium--140 mg or less in a serving.  Light in sodium--50% less sodium in a serving. For example, if a food that usually has 300 mg of sodium is changed to become light in sodium, it will have 150 mg of sodium.  Reduced sodium--25% less sodium in a serving. For example, if a food that usually has 400 mg of sodium is changed to reduced sodium, it will have 300 mg of sodium. WHAT FOODS CAN I EAT? Grains Low-sodium cereals, including oats, puffed wheat and rice, and shredded wheat cereals. Low-sodium crackers.  Unsalted rice and pasta. Lower-sodium bread.  Vegetables Frozen or fresh vegetables. Low-sodium or reduced-sodium canned vegetables. Low-sodium or reduced-sodium tomato sauce and paste. Low-sodium or reduced-sodium tomato and vegetable juices.  Fruits Fresh, frozen, and canned fruit. Fruit juice.  Meat and Other Protein Products Low-sodium canned tuna and salmon. Fresh or frozen meat, poultry, seafood, and fish. Lamb. Unsalted nuts. Dried beans, peas, and lentils without added salt. Unsalted canned beans. Homemade soups without salt. Eggs.  Dairy Milk. Soy milk. Ricotta cheese. Low-sodium or reduced-sodium cheeses. Yogurt.  Condiments Fresh and dried herbs and spices. Salt-free seasonings. Onion and garlic powders. Low-sodium varieties of mustard and ketchup. Fresh or refrigerated horseradish. Lemon juice.  Fats and Oils Reduced-sodium salad dressings. Unsalted butter.  Other Unsalted popcorn and pretzels.  The items listed above may not be a complete list of recommended foods or beverages. Contact your dietitian for more options. WHAT FOODS ARE NOT RECOMMENDED? Grains Instant hot cereals. Bread stuffing, pancake, and biscuit mixes. Croutons. Seasoned rice or pasta mixes. Noodle soup cups. Boxed or  frozen macaroni and cheese. Self-rising flour. Regular salted crackers. Vegetables Regular canned vegetables. Regular canned tomato sauce and paste. Regular tomato and vegetable juices. Frozen vegetables in sauces. Salted [place] fries. Olives. Angie Fava. Relishes. Sauerkraut. Salsa. Meat and Other Protein Products Salted, canned, smoked, spiced, or pickled meats, seafood, or fish. Bacon, ham, sausage, hot dogs, corned beef, chipped beef, and packaged luncheon meats. Salt pork. Jerky. Pickled herring. Anchovies, regular canned tuna, and sardines. Salted nuts. Dairy Processed cheese and cheese spreads. Cheese curds. Blue cheese and cottage cheese. Buttermilk.  Condiments Onion and  garlic salt, seasoned salt, table salt, and sea salt. Canned and packaged gravies. Worcestershire sauce. Tartar sauce. Barbecue sauce. Teriyaki sauce. Soy sauce, including reduced sodium. Steak sauce. Fish sauce. Oyster sauce. Cocktail sauce. Horseradish that you find on the shelf. Regular ketchup and mustard. Meat flavorings and tenderizers. Bouillon cubes. Hot sauce. Tabasco sauce. Marinades. Taco seasonings. Relishes. Fats and Oils Regular salad dressings. Salted butter. Margarine. Ghee. Bacon fat.  Other Potato and tortilla chips. Corn chips and puffs. Salted popcorn and pretzels. Canned or dried soups. Pizza. Frozen entrees and pot pies.  The items listed above may not be a complete list of foods and beverages to avoid. Contact your dietitian for more information.   This information is not intended to replace advice given to you by your health care provider. Make sure you discuss any questions you have with your health care provider.   Document Released: 05/30/2002 Document Revised: 12/29/2014 Document Reviewed: 10/12/2013 Elsevier Interactive Patient Education [employer].

## 2016-04-02 ENCOUNTER — Encounter: Payer: Self-pay | Admitting: Internal Medicine

## 2016-04-02 ENCOUNTER — Ambulatory Visit: Payer: Self-pay | Attending: Internal Medicine

## 2016-04-02 DIAGNOSIS — E669 Obesity, unspecified: Secondary | ICD-10-CM

## 2016-04-02 LAB — CBC WITH DIFFERENTIAL/PLATELET
Basophils Absolute: 0 cells/uL (ref 0–200)
Basophils Relative: 0 %
EOS PCT: 1 %
Eosinophils Absolute: 42 cells/uL (ref 15–500)
HCT: 39.6 % (ref 38.5–50.0)
Hemoglobin: 13.4 g/dL (ref 13.2–17.1)
LYMPHS ABS: 2730 {cells}/uL (ref 850–3900)
LYMPHS PCT: 65 %
MCH: 28.8 pg (ref 27.0–33.0)
MCHC: 33.8 g/dL (ref 32.0–36.0)
MCV: 85 fL (ref 80.0–100.0)
MPV: 10.2 fL (ref 7.5–12.5)
Monocytes Absolute: 336 cells/uL (ref 200–950)
Monocytes Relative: 8 %
NEUTROS PCT: 26 %
Neutro Abs: 1092 cells/uL — ABNORMAL LOW (ref 1500–7800)
PLATELETS: 235 10*3/uL (ref 140–400)
RBC: 4.66 MIL/uL (ref 4.20–5.80)
RDW: 15.5 % — AB (ref 11.0–15.0)
WBC: 4.2 10*3/uL (ref 3.8–10.8)

## 2016-04-02 LAB — BASIC METABOLIC PANEL
BUN: 17 mg/dL (ref 7–25)
CO2: 26 mmol/L (ref 20–31)
Calcium: 9.6 mg/dL (ref 8.6–10.3)
Chloride: 105 mmol/L (ref 98–110)
Creat: 1.07 mg/dL (ref 0.60–1.35)
Glucose, Bld: 91 mg/dL (ref 65–99)
POTASSIUM: 4.2 mmol/L (ref 3.5–5.3)
SODIUM: 138 mmol/L (ref 135–146)

## 2016-04-02 LAB — LIPID PANEL
CHOL/HDL RATIO: 5.4 ratio — AB (ref <=5.0)
CHOLESTEROL: 210 mg/dL — AB (ref 125–200)
HDL: 39 mg/dL — ABNORMAL LOW (ref >=40)
LDL Cholesterol: 149 mg/dL — ABNORMAL HIGH (ref <130)
TRIGLYCERIDES: 110 mg/dL (ref <150)
VLDL: 22 mg/dL (ref <30)

## 2016-04-07 ENCOUNTER — Other Ambulatory Visit: Payer: Self-pay | Admitting: Internal Medicine

## 2016-04-07 MED ORDER — SIMVASTATIN 40 MG PO TABS: 40.0000 mg | ORAL_TABLET | Freq: Every day | ORAL | Status: DC

## 2016-04-07 MED FILL — SIMVASTATIN 40 MG TABLET: 40 | 30 days supply | Qty: 30 | Fill #0

## 2016-04-08 ENCOUNTER — Telehealth: Payer: Self-pay | Admitting: *Deleted

## 2016-04-08 NOTE — Telephone Encounter (Signed)
-----   Message from Maren Reamer, MD sent at 04/07/2016  8:39 AM EDT ----- Please call patient w/ labs results.  Your cholesterol levels were all high.  Cholesterol 210 (goal <200); ldl 149 (goal <130).  To address this please limit saturated fat to no more than 7% of your calories, limit cholesterol to 200 mg/day, increase fiber and exercise as tolerated. I also prescribed Simvastatin 40mg  qday,  A cholesterol lowering medication to your regimen.   We will rechk levels in 4-71months.  Otherwise, other labs, kidney and blood counts all normal.

## 2016-04-08 NOTE — Telephone Encounter (Signed)
Patient verified DOB Patient is aware of lab results showing high cholesterol levels. Patient is advised to limit saturated fats and increase fiber and exercise. Patient is aware of simvastatin being ordered to aid in lowering cholesterol and patient may pick up prescription from [facility]. Patient was given certain foods which he can substitute and obtain more fiber from. Patient is aware of a recheck being completed in 4-6 months. Patient expressed his understanding and had no further questions at this time.

## 2016-05-05 MED FILL — SIMVASTATIN 40 MG TABLET: 40 | 30 days supply | Qty: 30 | Fill #1

## 2016-05-05 MED FILL — FLUTICASONE PROP 50 MCG SPR: 50 | 30 days supply | Qty: 16 | Fill #1

## 2016-05-05 MED FILL — ?PANTOPRAZOLE SOD DR 40MG: 40 MG | 30 days supply | Qty: 30 | Fill #1

## 2016-05-06 ENCOUNTER — Ambulatory Visit: Payer: Self-pay | Admitting: Internal Medicine

## 2016-06-04 MED FILL — ?PANTOPRAZOLE SOD DR 40MG: 40 MG | 30 days supply | Qty: 30 | Fill #2

## 2016-06-26 MED FILL — ?PANTOPRAZOLE SOD DR 40MG: 40 MG | 30 days supply | Qty: 30 | Fill #3

## 2016-06-26 MED FILL — ?SIMVASTATIN 40 MG TABLET: 40 MG | 30 days supply | Qty: 30 | Fill #2

## 2016-07-17 ENCOUNTER — Telehealth: Payer: Self-pay | Admitting: Internal Medicine

## 2016-07-17 NOTE — Telephone Encounter (Signed)
Pt requesting referral to gastrologist

## 2016-07-17 NOTE — Telephone Encounter (Signed)
Will forward to pcp

## 2016-07-18 NOTE — Telephone Encounter (Signed)
Why does he need the referral to gi, is it for his gerd? Has he started taking the PPI that was started in the last clinic? Also does he have insurance? Gi wont see w/o insurance, or orange card, etc. Please check on this first b4 I place referral. thx

## 2016-07-18 NOTE — Telephone Encounter (Signed)
Contacted pt to go over Dr. Janne Napoleon advice pt states he doesn't need the referral anymore/. Pt states he went to go see another GI doctor yesterday.

## 2016-08-18 MED FILL — ?SIMVASTATIN 40 MG TABLET: 40 MG | 30 days supply | Qty: 30 | Fill #3

## 2016-08-21 ENCOUNTER — Telehealth: Payer: Self-pay | Admitting: Internal Medicine

## 2016-08-21 NOTE — Telephone Encounter (Signed)
Pt came in stating that Rx placed by PCP for acid reflux did not work well Pt recently went to a Copy, Dr. Juanita Craver, and was prescribed Dexilant  Pt states Dexilant works well for his acid reflux and wants to know if PCP can switch him to [facility]  Does pt need OV for this switch?

## 2016-08-22 NOTE — Telephone Encounter (Signed)
Will forward to pcp

## 2016-08-26 MED ORDER — DEXLANSOPRAZOLE 30 MG PO CPDR: 30.0000 mg | DELAYED_RELEASE_CAPSULE | Freq: Every day | ORAL | 2 refills | Status: DC

## 2016-08-26 NOTE — Telephone Encounter (Signed)
Contacted pt. Pt did not answer lvm for pt to give me a call back at his earliest convenience

## 2016-08-26 NOTE — Telephone Encounter (Signed)
I filled the dexilant 30mg  qday. Similar ppi to his protonix, but he may do better on this rx. Our pharmacy does not carry this, may be special order, may be more expensive. thanks

## 2016-11-17 MED FILL — FLUTICASONE PROP 50 MCG SPR: 50 | 30 days supply | Qty: 16 | Fill #2

## 2017-01-03 ENCOUNTER — Emergency Department (HOSPITAL_COMMUNITY)
Admission: EM | Admit: 2017-01-03 | Discharge: 2017-01-03 | Disposition: A | Discharge status: Home / Self Care | Payer: Self-pay | Attending: Dermatology | Admitting: Dermatology

## 2017-01-03 DIAGNOSIS — Z87891 Personal history of nicotine dependence: Secondary | ICD-10-CM | POA: Insufficient documentation

## 2017-01-03 DIAGNOSIS — Z79899 Other long term (current) drug therapy: Secondary | ICD-10-CM | POA: Insufficient documentation

## 2017-01-03 DIAGNOSIS — L02212 Cutaneous abscess of back [any part, except buttock]: Secondary | ICD-10-CM | POA: Insufficient documentation

## 2017-01-03 DIAGNOSIS — Z5321 Procedure and treatment not carried out due to patient leaving prior to being seen by health care provider: Secondary | ICD-10-CM | POA: Insufficient documentation

## 2017-01-03 NOTE — ED Triage Notes (Signed)
Pt here because he had an abscess on his back that he had drained last night and was given Doxycycline for. PT states he came here because he thinks we can do a culture swab and have the results come back during the patient visit in the ED to make sure he is on the right antibiotics. His culture was obtained last night at [facility].

## 2017-05-28 IMAGING — CR DG HAND COMPLETE 3+V*R*
3 series · 3 of 3 positions shown · non-contrast
Comparison: None.

CLINICAL DATA: Laceration to the right fifth finger, after punching
hand through glass. Swelling across the palm. Initial encounter.

EXAM:
RIGHT HAND - COMPLETE 3+ VIEW

[hand pa]
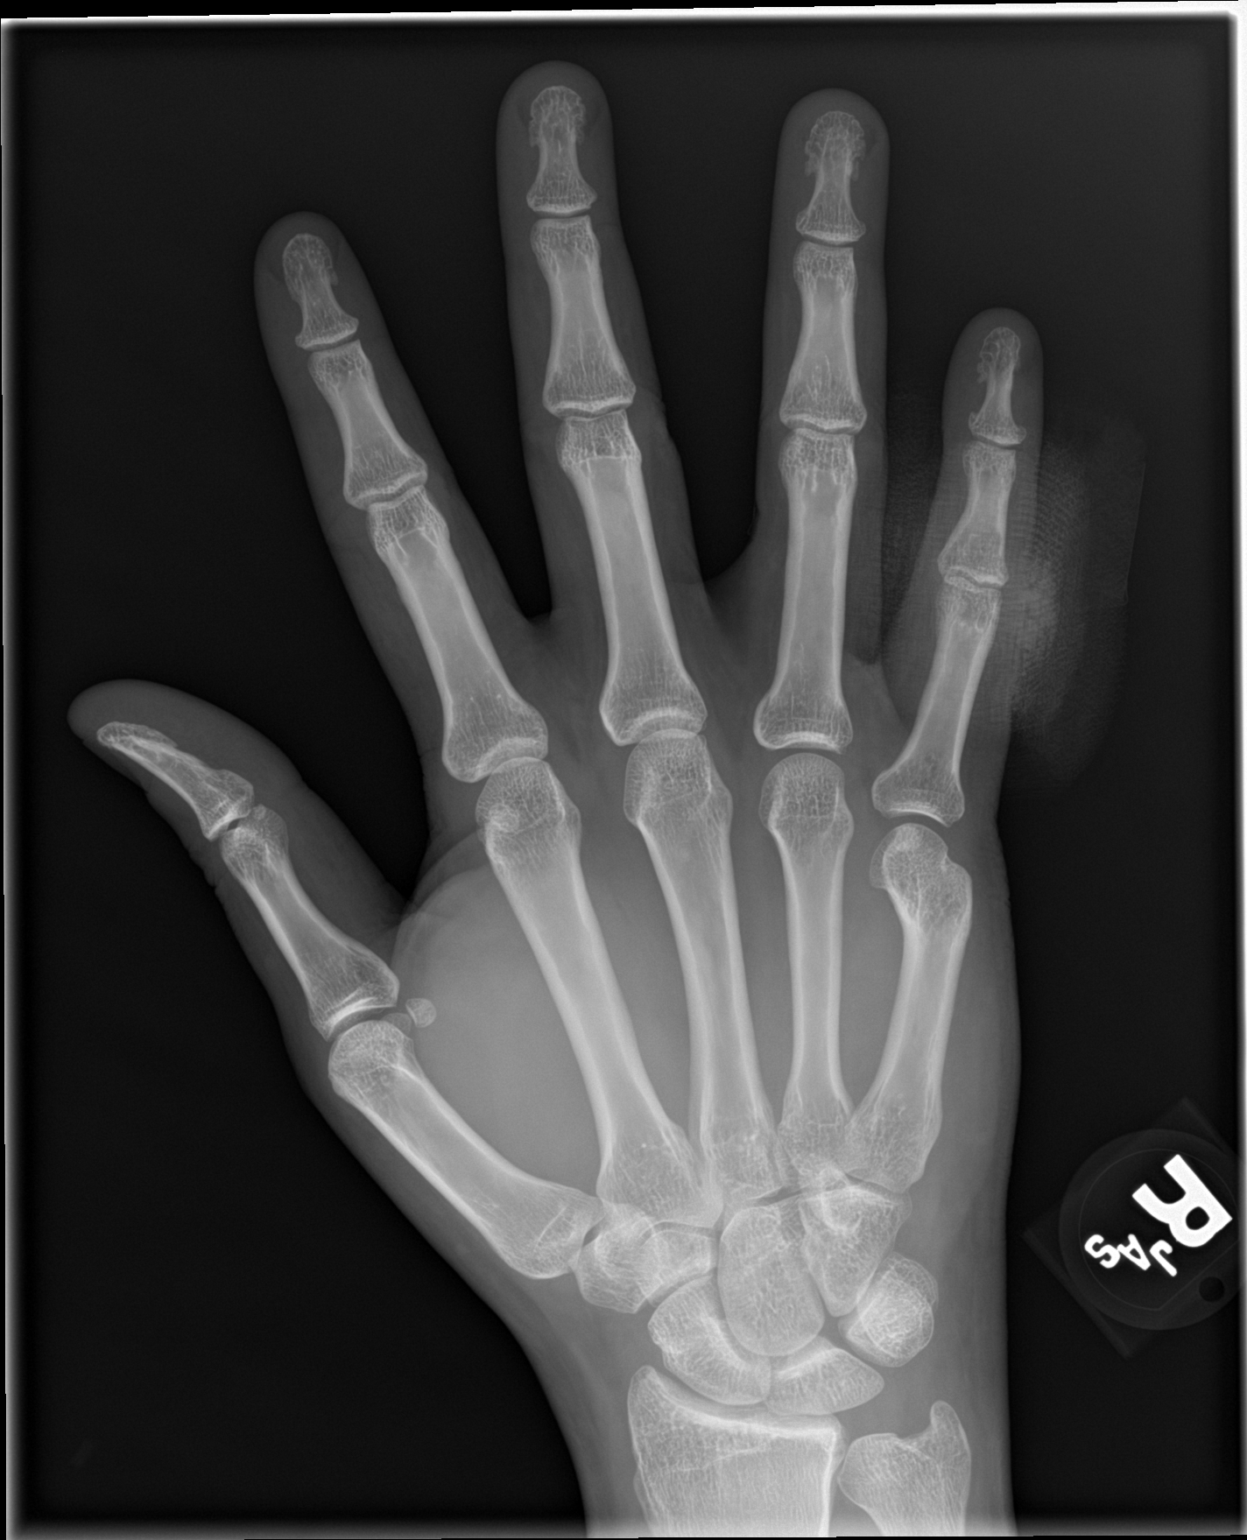

[hand obl]
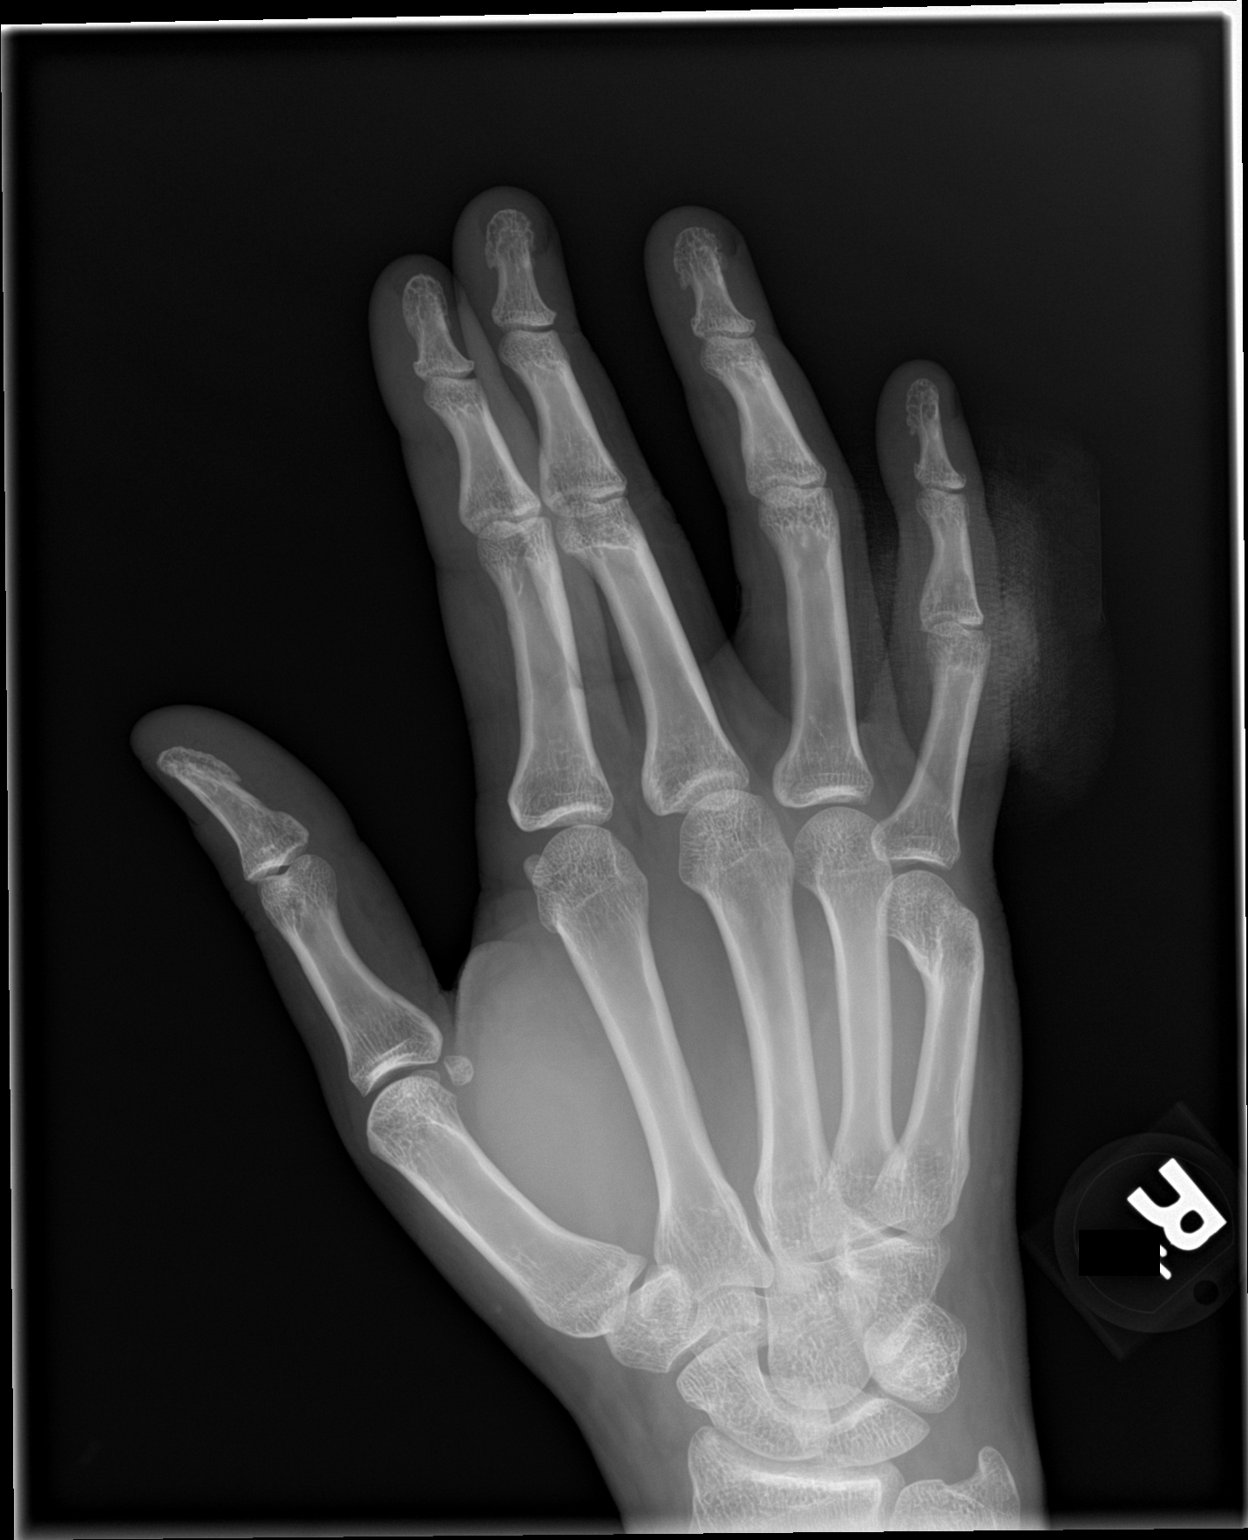

[hand lat]
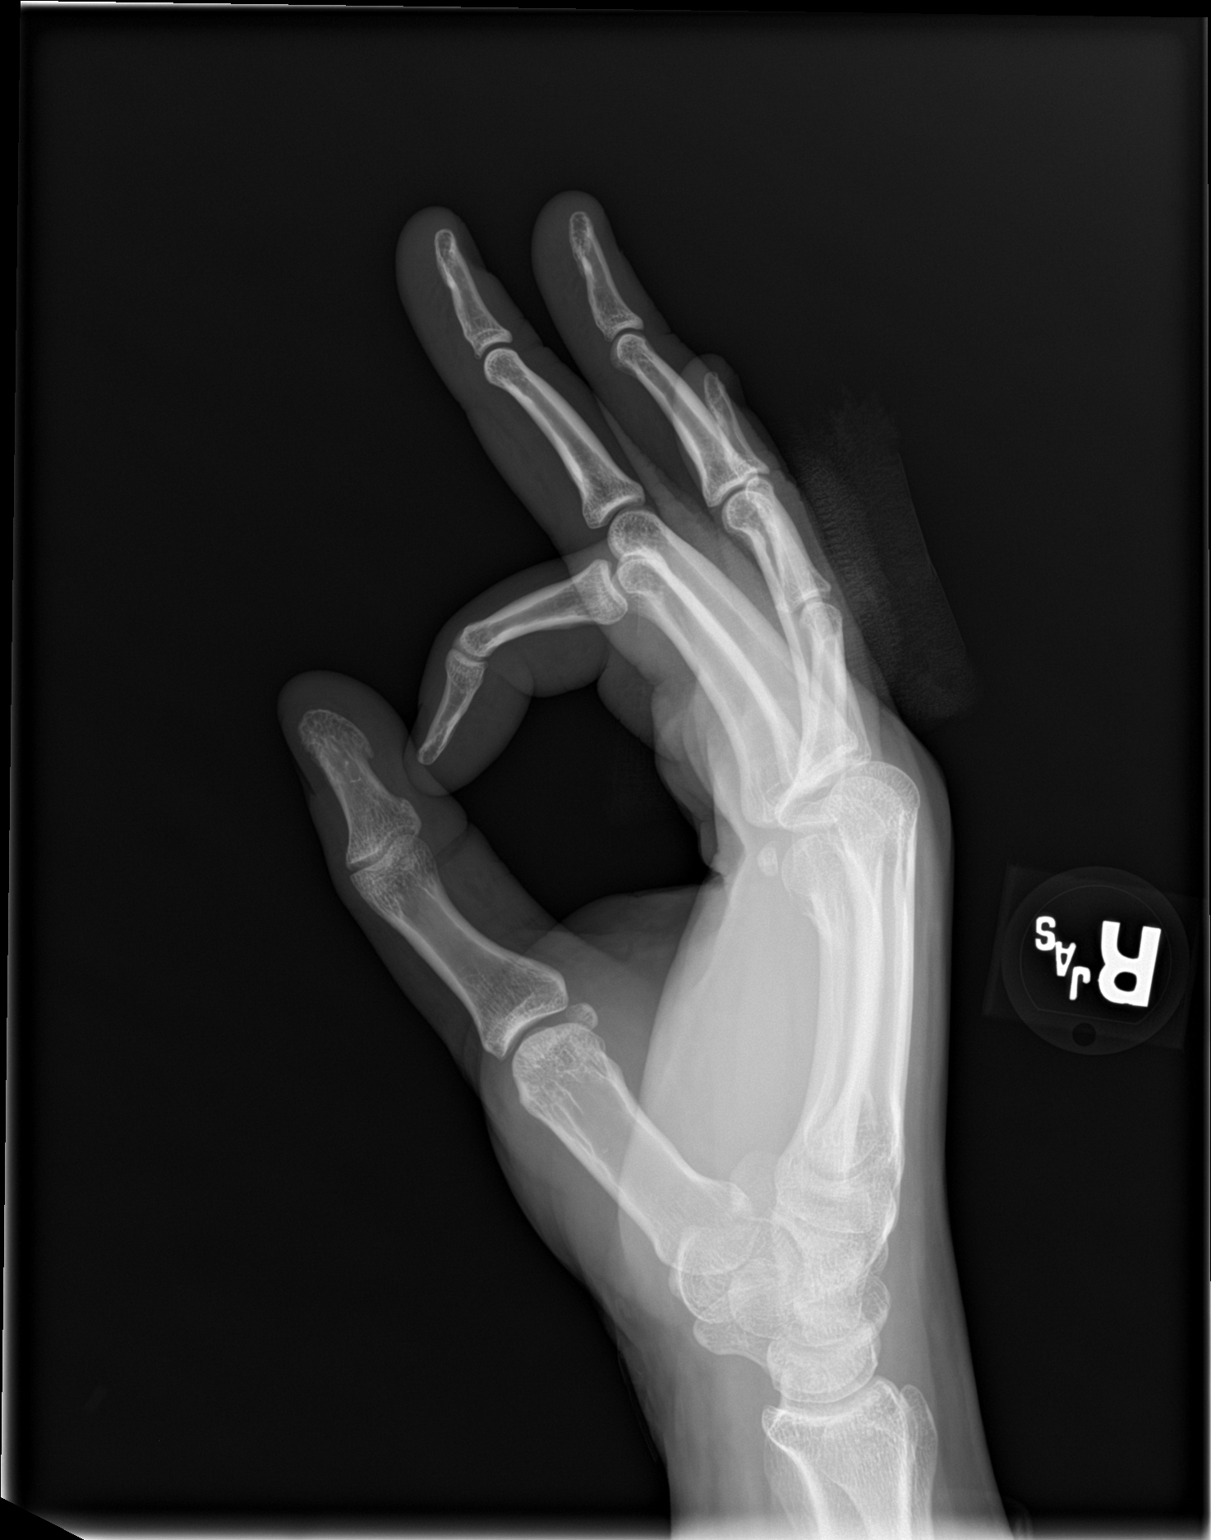

[3 of 3 positions shown; findings below may reference images not displayed]

FINDINGS: There is no evidence of fracture or dislocation. Mild negative ulnar
variance is noted. There is mild chronic deformity of the distal
fifth metacarpal. The joint spaces are preserved. The carpal rows
are intact, and demonstrate normal alignment. Soft tissue disruption
is noted at the fifth finger. No radiopaque foreign bodies are seen.
IMPRESSION: No evidence of fracture or dislocation. Soft tissue disruption at
the fifth finger.

## 2017-11-23 ENCOUNTER — Other Ambulatory Visit: Payer: Self-pay | Admitting: Family Medicine

## 2017-11-23 ENCOUNTER — Telehealth: Payer: Self-pay | Admitting: Family Medicine

## 2017-11-23 DIAGNOSIS — Z889 Allergy status to unspecified drugs, medicaments and biological substances status: Secondary | ICD-10-CM

## 2017-11-23 MED ORDER — FLUTICASONE PROPIONATE 50 MCG/ACT NA SUSP: 2.0000 | Freq: Every day | NASAL | 0 refills | Status: DC

## 2017-11-23 NOTE — Telephone Encounter (Signed)
Will refill only once. No further refills without office visit.

## 2017-11-23 NOTE — Telephone Encounter (Signed)
Pt called to request a refill for  fluticasone (FLONASE) 50 MCG/ACT nasal spray  He need a refill until he see the new provider please sent it to [facility] pharmacy, please follow up

## 2017-11-23 NOTE — Telephone Encounter (Signed)
Patient has not been seen since April 2017. I cannot approve this. Will forward to PCP that patient is establishing with

## 2017-11-24 NOTE — Telephone Encounter (Signed)
CMA call regarding medication refill sent to pharmacy & for additional needs an office visit   Patient Verify DOB   Patient was aware and undertsood

## 2017-12-17 ENCOUNTER — Ambulatory Visit: Payer: Self-pay | Admitting: Family Medicine

## 2018-02-05 ENCOUNTER — Encounter (HOSPITAL_COMMUNITY): Payer: Self-pay | Admitting: Emergency Medicine

## 2018-02-05 ENCOUNTER — Other Ambulatory Visit: Payer: Self-pay

## 2018-02-05 ENCOUNTER — Ambulatory Visit (HOSPITAL_COMMUNITY)
Admission: EM | Admit: 2018-02-05 | Discharge: 2018-02-05 | Disposition: A | Discharge status: Home / Self Care | Payer: Self-pay | Attending: Family Medicine | Admitting: Family Medicine

## 2018-02-05 DIAGNOSIS — J0141 Acute recurrent pansinusitis: Secondary | ICD-10-CM

## 2018-02-05 MED ORDER — AMOXICILLIN-POT CLAVULANATE 875-125 MG PO TABS: 1.0000 | ORAL_TABLET | Freq: Two times a day (BID) | ORAL | 0 refills | Status: AC

## 2018-02-05 MED ORDER — IPRATROPIUM BROMIDE 0.06 % NA SOLN: 2.0000 | Freq: Four times a day (QID) | NASAL | 0 refills | Status: AC

## 2018-02-05 NOTE — Discharge Instructions (Signed)
Start Augmentin for possible bacterial sinus infection. As discussed, this could also be due to a virus. Start flonase, atrovent nasal spray for nasal congestion/drainage. Continue sudafed. You can use over the counter nasal saline rinse such as neti pot for nasal congestion. Keep hydrated, your urine should be clear to pale yellow in color. Tylenol/motrin for fever and pain. Follow up with ENT for further evaluation if continue with sinus symptoms. Monitor for any worsening of symptoms, chest pain, shortness of breath, wheezing, swelling of the throat, follow up for reevaluation.   For sore throat try using a honey-based tea. Use 3 teaspoons of honey with juice squeezed from half lemon. Place shaved pieces of ginger into 1/2-1 cup of water and warm over stove top. Then mix the ingredients and repeat every 4 hours as needed.

## 2018-02-05 NOTE — ED Provider Notes (Signed)
[facility]    CSN: [id number] Arrival date & time: 02/05/18  1016     History   Chief Complaint Chief Complaint  Patient presents with  . URI    HPI Eliodoro Cowgill is a 37 y.o. male.   37 year old male come in for recurrent sinus symptoms.  States this first started in November, for he experiences sinus pressure, nasal congestion, rhinorrhea.  He followed up with his PCP, and has been using Flonase and Sudafed since.  States that he will have improved symptoms for 1-2 days, then symptoms returns.  Current episode started 3 days ago, after having 1 day of improved symptoms with 2-3 week of symptoms.  He continues to have sinus pressure, nonproductive cough, nasal congestion, rhinorrhea.  Denies fever, chills, night sweats.  Denies sore throat.  States he has been using Flonase and Sudafed continuously without relief.  Former smoker, 5-pack-year history. Positive sick contact.       Past Medical History:  Diagnosis Date  . Bronchitis   . Frequent sinus infections   . GERD (gastroesophageal reflux disease)     Patient Active Problem List   Diagnosis Date Noted  . PENILE LESION 08/28/2010    History reviewed. No pertinent surgical history.     Home Medications    Prior to Admission medications   Medication Sig Start Date End Date Taking? Authorizing Provider  acetaminophen (TYLENOL) 500 MG tablet Take 1,000 mg by mouth every 6 (six) hours as needed for fever.    [provider]  albuterol (PROVENTIL HFA;VENTOLIN HFA) 108 (90 BASE) MCG/ACT inhaler Inhale 2 puffs into the lungs every 4 (four) hours as needed for wheezing. Patient not taking: Reported on 04/01/2016 04/13/15   Liam Graham, PA-C  amoxicillin-clavulanate (AUGMENTIN) 875-125 MG tablet Take 1 tablet by mouth every 12 (twelve) hours. 02/05/18   Tasia Catchings, Kennedy Brines V, PA-C  calcium carbonate (TUMS - DOSED IN MG ELEMENTAL CALCIUM) 500 MG chewable tablet Chew 1 tablet by mouth daily.    [provider]  chlorpheniramine-HYDROcodone (TUSSIONEX PENNKINETIC ER) 10-8 MG/5ML SUER Take 5 mLs by mouth every 12 (twelve) hours as needed for cough. Patient not taking: Reported on 04/01/2016 03/03/16   Carlisle Cater, PA-C  Dexlansoprazole (DEXILANT) 30 MG capsule Take 1 capsule (30 mg total) by mouth daily. 08/26/16   Langeland, Dawn T, MD  fluticasone (FLONASE) 50 MCG/ACT nasal spray Place 2 sprays into both nostrils daily. 11/23/17   Alfonse Spruce, FNP  ibuprofen (ADVIL,MOTRIN) 800 MG tablet Take 800 mg by mouth every 6 (six) hours as needed for mild pain.     [provider]  ibuprofen (CHILDRENS MOTRIN) 100 MG/5ML suspension Take 200 mg by mouth every 4 (four) hours as needed for mild pain. Reported on 04/01/2016    [provider]  ipratropium (ATROVENT) 0.06 % nasal spray Place 2 sprays into both nostrils 4 (four) times daily. 02/05/18   Tasia Catchings, Whyatt Klinger V, PA-C  loratadine (CLARITIN) 10 MG tablet Take 1 tablet (10 mg total) by mouth daily. 04/01/16   Langeland, Leda Quail, MD  Phenylephrine-DM-GG-APAP (TYLENOL COLD/FLU SEVERE PO) Take 2 tablets by mouth every 4 (four) hours. Reported on 04/01/2016    [provider]  simvastatin (ZOCOR) 40 MG tablet Take 1 tablet (40 mg total) by mouth at bedtime. Patient not taking: Reported on 02/05/2018 04/07/16   Maren Reamer, MD    Family History Family History  Problem Relation Age of Onset  . Diabetes Mother   .  Asthma Other     Social History Social History   Tobacco Use  . Smoking status: Former Smoker  Substance Use Topics  . Alcohol use: Yes    Comment: occasional  . Drug use: No     Allergies   Patient has no known allergies.   Review of Systems Review of Systems  Reason unable to perform ROS: See HPI as above.     Physical Exam Triage Vital Signs ED Triage Vitals [02/05/18 1044]  Enc Vitals Group     BP (!) 110/91     Pulse Rate 97     Resp 18     Temp 98.2 F (36.8 C)     Temp src      SpO2 100 %      Weight      Height      Head Circumference      Peak Flow      Pain Score      Pain Loc      Pain Edu?      Excl. in [place]?    No data found.  Updated Vital Signs BP (!) 110/91   Pulse 97   Temp 98.2 F (36.8 C)   Resp 18   SpO2 100%   Physical Exam  Constitutional: He is oriented to person, place, and time. He appears well-developed and well-nourished. No distress.  HENT:  Head: Normocephalic and atraumatic.  Right Ear: Tympanic membrane, external ear and ear canal normal. Tympanic membrane is not erythematous and not bulging.  Left Ear: Tympanic membrane, external ear and ear canal normal. Tympanic membrane is not erythematous and not bulging.  Nose: Mucosal edema and rhinorrhea present. Right sinus exhibits maxillary sinus tenderness and frontal sinus tenderness. Left sinus exhibits maxillary sinus tenderness and frontal sinus tenderness.  Mouth/Throat: Uvula is midline, oropharynx is clear and moist and mucous membranes are normal.  Bilateral TM opaque.  Eyes: Conjunctivae are normal. Pupils are equal, round, and reactive to light.  Neck: Normal range of motion. Neck supple.  Cardiovascular: Normal rate, regular rhythm and normal heart sounds. Exam reveals no gallop and no friction rub.  No murmur heard. Pulmonary/Chest: Effort normal and breath sounds normal. He has no decreased breath sounds. He has no wheezes. He has no rhonchi. He has no rales.  Lymphadenopathy:    He has no cervical adenopathy.  Neurological: He is alert and oriented to person, place, and time.  Skin: Skin is warm and dry.  Psychiatric: He has a normal mood and affect. His behavior is normal. Judgment normal.     UC Treatments / Results  Labs (all labs ordered are listed, but only abnormal results are displayed) Labs Reviewed - No data to display  EKG  EKG Interpretation None       Radiology No results found.  Procedures Procedures (including critical care time)  Medications Ordered  in UC Medications - No data to display   Initial Impression / Assessment and Plan / UC Course  I have reviewed the triage vital signs and the nursing notes.  Pertinent labs & imaging results that were available during my care of the patient were reviewed by me and considered in my medical decision making (see chart for details).    Given recurrent symptoms, with current episode starting 3 days ago, but with only 1 day relief prior to a 2-3 week history of sinus pressure, will treat for sinusitis with Augmentin. Discussed possibility of viral infection given 1 day of  slight relief. Other symptomatic treatment discussed. Patient to follow up with ENT for further evaluation if symptoms continues. Return precautions given. Patient expresses understanding and agrees to plan.   Final Clinical Impressions(s) / UC Diagnoses   Final diagnoses:  Acute recurrent pansinusitis    ED Discharge Orders        Ordered    amoxicillin-clavulanate (AUGMENTIN) 875-125 MG tablet  Every 12 hours     02/05/18 1142    ipratropium (ATROVENT) 0.06 % nasal spray  4 times daily     02/05/18 1142       Ok Edwards, [place] 02/05/18 1150

## 2018-02-05 NOTE — ED Triage Notes (Signed)
Pt c/o issues with his sinuses since November, pt states hes had some housemates who had the flu and he wants to be sure he doesn't have that.

## 2018-05-13 ENCOUNTER — Ambulatory Visit (HOSPITAL_COMMUNITY)
Admission: EM | Admit: 2018-05-13 | Discharge: 2018-05-13 | Disposition: A | Discharge status: Home / Self Care | Payer: Self-pay | Attending: Family Medicine | Admitting: Family Medicine

## 2018-05-13 ENCOUNTER — Encounter (HOSPITAL_COMMUNITY): Payer: Self-pay | Admitting: Emergency Medicine

## 2018-05-13 DIAGNOSIS — R202 Paresthesia of skin: Secondary | ICD-10-CM

## 2018-05-13 LAB — GLUCOSE, CAPILLARY: GLUCOSE-CAPILLARY: 83 mg/dL (ref 65–99)

## 2018-05-13 NOTE — Discharge Instructions (Signed)
Please continue efforts to set up with a [facility] with [facility].  They can further evaluate your risk for cardiac disease.  At this time I do not think your symptoms are related to blocked arteries.  Please continue to work on increasing exercise as well as eating healthy.

## 2018-05-13 NOTE — ED Provider Notes (Signed)
[place]    CSN: [id number] Arrival date & time: 05/13/18  1100     History   Chief Complaint Chief Complaint  Patient presents with  . Leg Pain    HPI Derrick Greene is a 37 y.o. male no contributing past medical history presenting today for evaluation of tingling/numbness and right calf/foot.  Patient states that for the past 2 weeks he has had tingling sensations off and on.  Feels like his foot is asleep.  Denies pain or swelling.  Patient concerned about arterial disease from [employer].  Denies claudication.  Denies swelling.  Denies previous DVT/PE.  Patient has recently began working out more.  Denies history of high blood pressure, states that it typically runs similar to what it was today.  Has never been on medicine for this.  Mom has history of diabetes mellitus type 2.  HPI  Past Medical History:  Diagnosis Date  . Bronchitis   . Frequent sinus infections   . GERD (gastroesophageal reflux disease)     Patient Active Problem List   Diagnosis Date Noted  . PENILE LESION 08/28/2010    History reviewed. No pertinent surgical history.     Home Medications    Prior to Admission medications   Medication Sig Start Date End Date Taking? Authorizing Provider  loratadine (CLARITIN) 10 MG tablet Take 1 tablet (10 mg total) by mouth daily. 04/01/16  Yes Langeland, Dawn T, MD  acetaminophen (TYLENOL) 500 MG tablet Take 1,000 mg by mouth every 6 (six) hours as needed for fever.    [provider]  albuterol (PROVENTIL HFA;VENTOLIN HFA) 108 (90 BASE) MCG/ACT inhaler Inhale 2 puffs into the lungs every 4 (four) hours as needed for wheezing. Patient not taking: Reported on 04/01/2016 04/13/15   Liam Graham, PA-C  amoxicillin-clavulanate (AUGMENTIN) 875-125 MG tablet Take 1 tablet by mouth every 12 (twelve) hours. 02/05/18   Tasia Catchings, Amy V, PA-C  calcium carbonate (TUMS - DOSED IN MG ELEMENTAL CALCIUM) 500 MG chewable tablet Chew 1 tablet by mouth daily.     [provider]  chlorpheniramine-HYDROcodone (TUSSIONEX PENNKINETIC ER) 10-8 MG/5ML SUER Take 5 mLs by mouth every 12 (twelve) hours as needed for cough. Patient not taking: Reported on 04/01/2016 03/03/16   Carlisle Cater, PA-C  Dexlansoprazole (DEXILANT) 30 MG capsule Take 1 capsule (30 mg total) by mouth daily. 08/26/16   Langeland, Dawn T, MD  fluticasone (FLONASE) 50 MCG/ACT nasal spray Place 2 sprays into both nostrils daily. 11/23/17   Alfonse Spruce, FNP  ibuprofen (ADVIL,MOTRIN) 800 MG tablet Take 800 mg by mouth every 6 (six) hours as needed for mild pain.     [provider]  ibuprofen (CHILDRENS MOTRIN) 100 MG/5ML suspension Take 200 mg by mouth every 4 (four) hours as needed for mild pain. Reported on 04/01/2016    [provider]  ipratropium (ATROVENT) 0.06 % nasal spray Place 2 sprays into both nostrils 4 (four) times daily. 02/05/18   Tasia Catchings, Amy V, PA-C  Phenylephrine-DM-GG-APAP (TYLENOL COLD/FLU SEVERE PO) Take 2 tablets by mouth every 4 (four) hours. Reported on 04/01/2016    [provider]  simvastatin (ZOCOR) 40 MG tablet Take 1 tablet (40 mg total) by mouth at bedtime. Patient not taking: Reported on 02/05/2018 04/07/16   Maren Reamer, MD    Family History Family History  Problem Relation Age of Onset  . Diabetes Mother   . Asthma Other     Social History Social History  Tobacco Use  . Smoking status: Former Smoker  Substance Use Topics  . Alcohol use: Yes    Comment: occasional  . Drug use: No     Allergies   Patient has no known allergies.   Review of Systems Review of Systems  Constitutional: Negative for activity change, appetite change and fever.  HENT: Negative for trouble swallowing.   Eyes: Negative for visual disturbance.  Respiratory: Negative for shortness of breath.   Cardiovascular: Negative for chest pain and leg swelling.  Gastrointestinal: Negative for abdominal pain, nausea and vomiting.    Musculoskeletal: Negative for arthralgias, gait problem, myalgias, neck pain and neck stiffness.  Skin: Negative for wound.  Neurological: Positive for numbness. Negative for dizziness, syncope, speech difficulty, weakness, light-headedness and headaches.     Physical Exam Triage Vital Signs ED Triage Vitals  Enc Vitals Group     BP 05/13/18 1148 120/89     Pulse Rate 05/13/18 1146 65     Resp 05/13/18 1146 16     Temp 05/13/18 1146 98.1 F (36.7 C)     Temp Source 05/13/18 1146 Oral     SpO2 05/13/18 1146 100 %     Weight 05/13/18 1147 236 lb (107 kg)     Height --      Head Circumference --      Peak Flow --      Pain Score 05/13/18 1147 0     Pain Loc --      Pain Edu? --      Excl. in [place]? --    No data found.  Updated Vital Signs BP 120/89   Pulse 65   Temp 98.1 F (36.7 C) (Oral)   Resp 16   Wt 236 lb (107 kg)   SpO2 100%   BMI 32.01 kg/m   Visual Acuity Right Eye Distance:   Left Eye Distance:   Bilateral Distance:    Right Eye Near:   Left Eye Near:    Bilateral Near:     Physical Exam  Constitutional: He appears well-developed and well-nourished.  HENT:  Head: Normocephalic and atraumatic.  Eyes: Conjunctivae are normal.  Neck: Neck supple.  Cardiovascular: Normal rate and regular rhythm.  No murmur heard. Pulmonary/Chest: Effort normal and breath sounds normal. No respiratory distress.  Abdominal: Soft. There is no tenderness.  Musculoskeletal: He exhibits no edema.  No obvious swelling or deformity to right lower extremity, dorsalis pedis 2+ bilaterally, no swelling, no overlying erythema, loss of hair or skin changes.  Feet feel moderately warm bilaterally, cap refill less than 2 seconds.  Full active range of motion of foot at ankle and knee.  Neurological: He is alert.  Skin: Skin is warm and dry.  Psychiatric: He has a normal mood and affect.  Nursing note and vitals reviewed.    UC Treatments / Results  Labs (all labs ordered are  listed, but only abnormal results are displayed) Labs Reviewed  GLUCOSE, CAPILLARY    EKG None  Radiology No results found.  Procedures Procedures (including critical care time)  Medications Ordered in UC Medications - No data to display  Initial Impression / Assessment and Plan / UC Course  I have reviewed the triage vital signs and the nursing notes.  Pertinent labs & imaging results that were available during my care of the patient were reviewed by me and considered in my medical decision making (see chart for details).     Blood sugar 83 at visit.  No  risk factors for arterial disease.  No sign of DVT/PE.  At this time unclear cause of tingling sensation.  Since it is intermittent, will have patient continue efforts to set up PCP.  Discussed continuing efforts to live a healthier lifestyle with increased exercise and healthier diet in order to reduce risk for complications like PAD down the road.Discussed strict return precautions. Patient verbalized understanding and is agreeable with plan.  Final Clinical Impressions(s) / UC Diagnoses   Final diagnoses:  Paresthesia of lower extremity     Discharge Instructions     Please continue efforts to set up with a PCP with [facility].  They can further evaluate your risk for cardiac disease.  At this time I do not think your symptoms are related to blocked arteries.  Please continue to work on increasing exercise as well as eating healthy.   ED Prescriptions    None     Controlled Substance Prescriptions  Controlled Substance Registry consulted? Not Applicable   Janith Lima, [place] 05/13/18 1253

## 2018-05-13 NOTE — ED Triage Notes (Addendum)
PT reports a "tingling" sensation in right calf and sometimes in right toes.  PT has been doing research and is concerned about an arterial issue.  First occurred a few weeks ago and has come and gone since then.   Just got back to working out 3 weeks ago

## 2019-01-05 ENCOUNTER — Ambulatory Visit: Payer: Self-pay | Admitting: Family Medicine

## 2019-02-02 ENCOUNTER — Encounter: Payer: Self-pay | Admitting: Family Medicine

## 2019-02-02 ENCOUNTER — Ambulatory Visit: Payer: Self-pay | Attending: Family Medicine | Admitting: Family Medicine

## 2019-02-02 VITALS — BP 129/86 | HR 76 | Temp 98.0°F | Resp 16 | Ht 72.5 in | Wt 239.0 lb

## 2019-02-02 DIAGNOSIS — K219 Gastro-esophageal reflux disease without esophagitis: Secondary | ICD-10-CM | POA: Insufficient documentation

## 2019-02-02 DIAGNOSIS — E782 Mixed hyperlipidemia: Secondary | ICD-10-CM

## 2019-02-02 DIAGNOSIS — G479 Sleep disorder, unspecified: Secondary | ICD-10-CM

## 2019-02-02 DIAGNOSIS — E66811 Obesity, class 1: Secondary | ICD-10-CM

## 2019-02-02 DIAGNOSIS — E669 Obesity, unspecified: Secondary | ICD-10-CM

## 2019-02-02 DIAGNOSIS — J309 Allergic rhinitis, unspecified: Secondary | ICD-10-CM | POA: Insufficient documentation

## 2019-02-02 DIAGNOSIS — Z889 Allergy status to unspecified drugs, medicaments and biological substances status: Secondary | ICD-10-CM

## 2019-02-02 MED ORDER — CETIRIZINE HCL 10 MG PO TABS: 10.0000 mg | ORAL_TABLET | Freq: Every day | ORAL | 11 refills | Status: AC

## 2019-02-02 MED ORDER — LANSOPRAZOLE 30 MG PO CPDR: DELAYED_RELEASE_CAPSULE | ORAL | 6 refills | Status: AC

## 2019-02-02 MED ORDER — FLUTICASONE PROPIONATE 50 MCG/ACT NA SUSP: 2.0000 | Freq: Every day | NASAL | 0 refills | Status: AC

## 2019-02-02 NOTE — Progress Notes (Signed)
Wanted to get a physical C/o RLQ pain x 2. Denies emesis Lab work

## 2019-02-02 NOTE — Patient Instructions (Signed)

## 2019-02-02 NOTE — Progress Notes (Signed)
Subjective:    Patient ID: Derrick Greene, male    DOB: [birth year], 38 y.o.   MRN: [id number]  HPI       38 yo male new to the practice. Patient has had GERD and a history of increased lipids with the use of simvastatin as well as history of recurrent sinusitis/allergic rhinitis.  Patient states that he is not currently taking his simvastatin.  Patient reports no issues with past use of the medication.  Patient states that he was on pantoprazole and Dexilant in the past for GERD.  Patient states that he cannot remember which medication helped in which medication did not help.  Patient states that he was on 1 of the medications and then switched to a different medication.  Patient states that he was provided with samples of the medication and then when he tried to fill the prescription, the cost would be over $200 therefore is not been on reflux medication for a while.  Patient believes that the medication was most likely the [place] as he now recalls that the cost of the refill was what kept him from continuing the medication.  Patient reports that he has occasional nausea after certain foods as well as burping/belching and occasionally gets backwash of a bad tasting fluid into his throat/mouth.       Patient reports that he also does have some daytime fatigue and does tend to fall asleep if he is watching television/watching a movie.  Patient does not feel as if he will fall asleep while driving.  Patient does have non-restful sleep and patient is pretty sure that he snores.  Patient has never had any testing for sleep apnea.  Patient does have chronic issues with nasal congestion/allergic rhinitis and does feel at times that he cannot breathe through his nose when he lies down at night.  Patient has tried Flonase in the past and would like to have a refill of the medication.  Past Medical History:  Diagnosis Date  . Bronchitis   . Frequent sinus infections   . GERD (gastroesophageal reflux disease)     . Hyperlipidemia    Past Surgical History:  Procedure Laterality Date  . NO PAST SURGERIES     Family History  Problem Relation Age of Onset  . Diabetes Mother   . Asthma Other    Social History   Tobacco Use  . Smoking status: Former Research scientist (life sciences)  . Smokeless tobacco: Never Used  Substance Use Topics  . Alcohol use: Yes    Comment: occasional  . Drug use: No  No Known Allergies    Review of Systems  Constitutional: Positive for fatigue (occasional). Negative for chills and fever.  HENT: Negative for sore throat and trouble swallowing.   Respiratory: Negative for cough.   Cardiovascular: Negative for chest pain, palpitations and leg swelling.  Gastrointestinal: Positive for abdominal pain (occasional) and nausea. Negative for blood in stool, constipation, diarrhea and vomiting.  Endocrine: Negative for cold intolerance, heat intolerance, polydipsia, polyphagia and polyuria.  Genitourinary: Negative for dysuria and frequency.  Musculoskeletal: Negative for arthralgias, back pain and gait problem.  Neurological: Negative for dizziness and headaches.  Hematological: Negative for adenopathy. Does not bruise/bleed easily.       Objective:   Physical Exam BP 129/86 (BP Location: Left Arm, Patient Position: Sitting, Cuff Size: Large)   Pulse 76   Temp 98 F (36.7 C) (Oral)   Resp 16   Ht 6' 0.5" (1.842 m)   Wt 239  lb (108.4 kg)   SpO2 98%   BMI 31.97 kg/m Nurse's notes and vital signs reviewed at today's visit  General-well-nourished, well-developed overweight for height/obese male in no acute distress ENT- TMs dull, patient with moderate edema of the nasal turbinates, nasal mucosa is pale and edematous.  Patient with posterior pharynx erythema but patient with a narrowed posterior airway and large tongue base Neck-supple, no lymphadenopathy, no carotid bruit Lungs- clear to auscultation bilaterally Cardiovascular-regular rate and rhythm Back-no CVA tenderness Abdomen-  mild truncal obesity, patient with mild discomfort over the epigastric area but no rebound or guarding Extremities-no edema        Assessment & Plan:  1. Gastroesophageal reflux disease, esophagitis presence not specified Patient reports history of GERD and states that he was previously on pantoprazole but this was not helpful and he was then placed on Dexilant however the Dexilant was too expensive when he attempted to refill it and therefore he has not been on any medication recently for acid reflux.  Patient was given prescription for lansoprazole which is related to [place].  Patient is also encouraged to avoid late night eating and avoid known trigger foods.  Patient should avoid spicy/greasy foods.  If symptoms continue, patient may need further evaluation such as testing for H. pylori and abdominal ultrasound to rule out gallbladder disease/gallstones or fatty liver is additional causes of nausea and reflux symptoms. - lansoprazole (PREVACID) 30 MG capsule; One capsule once daily to decrease stomach acid  Dispense: 30 capsule; Refill: 6  2. Chronic allergic rhinitis Patient with chronic allergic rhinitis and prescription provided for Flonase which patient is used in the past as well as Zyrtec to take 1 at bedtime to help decrease nasal congestion.   - fluticasone (FLONASE) 50 MCG/ACT nasal spray; Place 2 sprays into both nostrils daily.  Dispense: 16 g; Refill: 0 - cetirizine (ZYRTEC) 10 MG tablet; Take 1 tablet (10 mg total) by mouth daily. At bedtime to decrease nasal congestion  Dispense: 30 tablet; Refill: 11  3. Sleep disturbance Patient with snoring, narrowed posterior airway and daytime fatigue.  Patient agrees to be scheduled for sleep study in evaluation of possible sleep apnea - [facility] Sleep Study; Future  4. Mixed hyperlipidemia Patient with mixed hyperlipidemia and patient will have lipid panel at today's visit.  Patient will also have CMP.  Patient encouraged to follow a  low-fat diet along with regular exercise program.  Patient should continue the use of simvastatin but will be notified if a change in medication is needed or warranted based on the lab results - Comprehensive metabolic panel - Lipid panel  5. Obesity (BMI 30.0-34.9) Patient with obesity and patient is encouraged to follow a low-fat/low calorie diet along with regular exercise program of at least 30 minutes 5 days/week to help with weight loss and cardiovascular health.   - Comprehensive metabolic panel - Lipid panel  An After Visit Summary was printed and given to the patient.  Return in about 6 weeks (around 03/16/2019) for GERD.

## 2019-02-03 LAB — COMPREHENSIVE METABOLIC PANEL WITH GFR
ALT: 18 IU/L (ref 0–44)
AST: 21 IU/L (ref 0–40)
Albumin/Globulin Ratio: 2 (ref 1.2–2.2)
Albumin: 4.8 g/dL (ref 4.0–5.0)
Alkaline Phosphatase: 57 IU/L (ref 39–117)
BUN/Creatinine Ratio: 11 (ref 9–20)
BUN: 13 mg/dL (ref 6–20)
Bilirubin Total: 0.4 mg/dL (ref 0.0–1.2)
CO2: 23 mmol/L (ref 20–29)
Calcium: 9.8 mg/dL (ref 8.7–10.2)
Chloride: 99 mmol/L (ref 96–106)
Creatinine, Ser: 1.16 mg/dL (ref 0.76–1.27)
GFR calc Af Amer: 94 mL/min/1.73
GFR calc non Af Amer: 81 mL/min/1.73
Globulin, Total: 2.4 g/dL (ref 1.5–4.5)
Glucose: 81 mg/dL (ref 65–99)
Potassium: 4.4 mmol/L (ref 3.5–5.2)
Sodium: 139 mmol/L (ref 134–144)
Total Protein: 7.2 g/dL (ref 6.0–8.5)

## 2019-02-03 LAB — LIPID PANEL
Chol/HDL Ratio: 5.5 ratio — ABNORMAL HIGH (ref 0.0–5.0)
Cholesterol, Total: 241 mg/dL — ABNORMAL HIGH (ref 100–199)
HDL: 44 mg/dL
LDL Calculated: 161 mg/dL — ABNORMAL HIGH (ref 0–99)
Triglycerides: 179 mg/dL — ABNORMAL HIGH (ref 0–149)
VLDL Cholesterol Cal: 36 mg/dL (ref 5–40)

## 2019-02-04 ENCOUNTER — Telehealth: Payer: Self-pay | Admitting: *Deleted

## 2019-02-04 ENCOUNTER — Telehealth: Payer: Self-pay | Admitting: Family Medicine

## 2019-02-04 NOTE — Telephone Encounter (Signed)
Follow up ° ° ° ° °Pt returning call regarding results ° °

## 2019-02-04 NOTE — Telephone Encounter (Signed)
.  Patient verified DOB Patient is aware of labs being normal except for cholesterol level and needing to limit fats and breads and implement exercise and water.

## 2019-02-04 NOTE — Telephone Encounter (Signed)
-----   Message from Antony Blackbird, MD sent at 02/03/2019 12:48 PM EST ----- Notify patient of normal CMET but his lipid panel indicates high cholesterol with total cholesterol of 241, TG of 179, LDL/bad cholesterol of 161. He needs to resume a low fat diet along with regular exercise and recheck lipids in 6-12 months

## 2019-02-04 NOTE — Telephone Encounter (Signed)
Medical Assistant left message on patient's home and cell voicemail. Voicemail states to give a call back to Nubia with [facility] at [phone].  

## 2019-02-05 ENCOUNTER — Encounter (HOSPITAL_COMMUNITY): Payer: Self-pay | Admitting: Emergency Medicine

## 2019-02-05 ENCOUNTER — Emergency Department (HOSPITAL_COMMUNITY)
Admission: EM | Admit: 2019-02-05 | Discharge: 2019-02-06 | Disposition: A | Discharge status: Home / Self Care | Payer: Self-pay | Attending: Emergency Medicine | Admitting: Emergency Medicine

## 2019-02-05 ENCOUNTER — Other Ambulatory Visit: Payer: Self-pay

## 2019-02-05 DIAGNOSIS — R1011 Right upper quadrant pain: Secondary | ICD-10-CM

## 2019-02-05 DIAGNOSIS — R110 Nausea: Secondary | ICD-10-CM | POA: Insufficient documentation

## 2019-02-05 DIAGNOSIS — Z79899 Other long term (current) drug therapy: Secondary | ICD-10-CM | POA: Insufficient documentation

## 2019-02-05 DIAGNOSIS — Z87891 Personal history of nicotine dependence: Secondary | ICD-10-CM | POA: Insufficient documentation

## 2019-02-05 DIAGNOSIS — K8020 Calculus of gallbladder without cholecystitis without obstruction: Secondary | ICD-10-CM | POA: Insufficient documentation

## 2019-02-05 LAB — URINALYSIS, ROUTINE W REFLEX MICROSCOPIC
BILIRUBIN URINE: NEGATIVE
GLUCOSE, UA: NEGATIVE mg/dL
Hgb urine dipstick: NEGATIVE
KETONES UR: NEGATIVE mg/dL
LEUKOCYTE UA: NEGATIVE
Nitrite: NEGATIVE
PROTEIN: NEGATIVE mg/dL
Specific Gravity, Urine: 1.023 (ref 1.005–1.030)
pH: 5 (ref 5.0–8.0)

## 2019-02-05 LAB — CBC
HCT: 40.9 % (ref 39.0–52.0)
HEMOGLOBIN: 13.9 g/dL (ref 13.0–17.0)
MCH: 28.8 pg (ref 26.0–34.0)
MCHC: 34 g/dL (ref 30.0–36.0)
MCV: 84.9 fL (ref 80.0–100.0)
Platelets: 247 10*3/uL (ref 150–400)
RBC: 4.82 MIL/uL (ref 4.22–5.81)
RDW: 13.3 % (ref 11.5–15.5)
WBC: 6.8 10*3/uL (ref 4.0–10.5)
nRBC: 0 % (ref 0.0–0.2)

## 2019-02-05 NOTE — ED Triage Notes (Signed)
C/o intermittent R sided abd pain with nausea since Monday.  History of acid reflux.  States he used to take Nexium but no longer takes it.  Denies vomiting and diarrhea.

## 2019-02-06 ENCOUNTER — Encounter: Payer: Self-pay | Admitting: Family Medicine

## 2019-02-06 ENCOUNTER — Emergency Department (HOSPITAL_COMMUNITY): Payer: Self-pay

## 2019-02-06 LAB — COMPREHENSIVE METABOLIC PANEL
ALK PHOS: 45 U/L (ref 38–126)
ALT: 20 U/L (ref 0–44)
AST: 24 U/L (ref 15–41)
Albumin: 4.2 g/dL (ref 3.5–5.0)
Anion gap: 9 (ref 5–15)
BILIRUBIN TOTAL: 0.7 mg/dL (ref 0.3–1.2)
BUN: 12 mg/dL (ref 6–20)
CALCIUM: 8.9 mg/dL (ref 8.9–10.3)
CO2: 24 mmol/L (ref 22–32)
Chloride: 99 mmol/L (ref 98–111)
Creatinine, Ser: 1.21 mg/dL (ref 0.61–1.24)
GFR calc non Af Amer: >60 mL/min (ref >60)
Glucose, Bld: 86 mg/dL (ref 70–99)
Potassium: 3.5 mmol/L (ref 3.5–5.1)
Sodium: 132 mmol/L — ABNORMAL LOW (ref 135–145)
TOTAL PROTEIN: 7.2 g/dL (ref 6.5–8.1)

## 2019-02-06 LAB — LIPASE, BLOOD: Lipase: 32 U/L (ref 11–51)

## 2019-02-06 MED ORDER — ONDANSETRON 4 MG PO TBDP: ORAL_TABLET | ORAL | 0 refills | Status: AC

## 2019-02-06 MED ORDER — ALUM & MAG HYDROXIDE-SIMETH 200-200-20 MG/5ML PO SUSP
30.0000 mL | Freq: Once | ORAL | Status: AC
  Administered 2019-02-06: 30 mL via ORAL
  Filled 2019-02-06: qty 30

## 2019-02-06 MED ORDER — OMEPRAZOLE 20 MG PO CPDR: 20.0000 mg | DELAYED_RELEASE_CAPSULE | Freq: Every day | ORAL | 0 refills | Status: AC

## 2019-02-06 MED ORDER — RANITIDINE HCL 150 MG PO TABS: 150.0000 mg | ORAL_TABLET | Freq: Two times a day (BID) | ORAL | 0 refills | Status: AC

## 2019-02-06 MED ORDER — LIDOCAINE VISCOUS HCL 2 % MT SOLN
15.0000 mL | Freq: Once | OROMUCOSAL | Status: AC
  Administered 2019-02-06: 15 mL via ORAL
  Filled 2019-02-06: qty 15

## 2019-02-06 NOTE — ED Notes (Signed)
Taken to US.

## 2019-02-06 NOTE — ED Notes (Signed)
Patient verbalized understanding of dc instructions, vss, ambulatory with nad.   

## 2019-02-06 NOTE — ED Provider Notes (Signed)
[facility] Provider Note   CSN: [id number] Arrival date & time: 02/05/19  2302     History   Chief Complaint Chief Complaint  Patient presents with  . Abdominal Pain    HPI Derrick Greene is a 38 y.o. male with a hx of GERD, bronchitis, hyperlipidemia presents to the Emergency Department complaining of gradual, sing and waning but progressively worsening upper quadrant abdominal pain.  Patient reports pain is dull and aching in nature.  He reports it is associated with persistent nausea but no vomiting or diarrhea.  Patient states pain worsens several hours after eating but not eating does not seem to change his pain.  He has taken Tums with minimal relief.  Patient reports the pain is significantly worse at night and he has had episodes of acid wash and coughing.  He reports that for the last several nights he has been sleeping sitting up as this seems to help his symptoms.  Patient reports a history of GERD but is currently not taking any PPIs.  He reports that he was previously taking a PPI but his GERD improved after his weight loss.  He states that he has since noticed return of symptoms after gaining some weight back.  He is not currently seeing a gastroenterologist.  He has never had an EGD or diagnosis of peptic ulcer disease.  He denies hematemesis, melena or hematochezia.  Patient reports his pain is mild in nature and does not radiate.  The history is provided by the patient and medical records. No language interpreter was used.    Past Medical History:  Diagnosis Date  . Bronchitis   . Frequent sinus infections   . GERD (gastroesophageal reflux disease)   . Hyperlipidemia     Patient Active Problem List   Diagnosis Date Noted  . Gastroesophageal reflux disease 02/02/2019  . Chronic allergic rhinitis 02/02/2019  . PENILE LESION 08/28/2010    History reviewed. No pertinent surgical history.      Home Medications    Prior to  Admission medications   Medication Sig Start Date End Date Taking? Authorizing Provider  acetaminophen (TYLENOL) 500 MG tablet Take 1,000 mg by mouth every 6 (six) hours as needed for fever.    [provider]  albuterol (PROVENTIL HFA;VENTOLIN HFA) 108 (90 BASE) MCG/ACT inhaler Inhale 2 puffs into the lungs every 4 (four) hours as needed for wheezing. Patient not taking: Reported on 04/01/2016 04/13/15   Liam Graham, PA-C  amoxicillin-clavulanate (AUGMENTIN) 875-125 MG tablet Take 1 tablet by mouth every 12 (twelve) hours. Patient not taking: Reported on 02/02/2019 02/05/18   Ok Edwards, PA-C  calcium carbonate (TUMS - DOSED IN MG ELEMENTAL CALCIUM) 500 MG chewable tablet Chew 1 tablet by mouth daily.    [provider]  cetirizine (ZYRTEC) 10 MG tablet Take 1 tablet (10 mg total) by mouth daily. At bedtime to decrease nasal congestion 02/02/19   Fulp, Cammie, MD  chlorpheniramine-HYDROcodone (TUSSIONEX PENNKINETIC ER) 10-8 MG/5ML SUER Take 5 mLs by mouth every 12 (twelve) hours as needed for cough. Patient not taking: Reported on 04/01/2016 03/03/16   Carlisle Cater, PA-C  fluticasone [facility]) 50 MCG/ACT nasal spray Place 2 sprays into both nostrils daily. 02/02/19   Fulp, Cammie, MD  ibuprofen (ADVIL,MOTRIN) 800 MG tablet Take 800 mg by mouth every 6 (six) hours as needed for mild pain.     [provider]  ibuprofen (CHILDRENS MOTRIN) 100 MG/5ML suspension Take 200 mg by  mouth every 4 (four) hours as needed for mild pain. Reported on 04/01/2016    [provider]  ipratropium (ATROVENT) 0.06 % nasal spray Place 2 sprays into both nostrils 4 (four) times daily. 02/05/18   Ok Edwards, PA-C  lansoprazole (PREVACID) 30 MG capsule One capsule once daily to decrease stomach acid 02/02/19   Fulp, Cammie, MD  omeprazole (PRILOSEC) 20 MG capsule Take 1 capsule (20 mg total) by mouth daily. 02/06/19   Jatara Huettner, Jarrett Soho, PA-C  ondansetron (ZOFRAN ODT) 4 MG disintegrating  tablet 4mg  ODT q4 hours prn nausea/vomit 02/06/19   Tonni Mansour, Jarrett Soho, PA-C  Phenylephrine-DM-GG-APAP (TYLENOL COLD/FLU SEVERE PO) Take 2 tablets by mouth every 4 (four) hours. Reported on 04/01/2016    [provider]  ranitidine (ZANTAC) 150 MG tablet Take 1 tablet (150 mg total) by mouth 2 (two) times daily. 02/06/19   Ardie Dragoo, Jarrett Soho, PA-C  simvastatin (ZOCOR) 40 MG tablet Take 1 tablet (40 mg total) by mouth at bedtime. Patient not taking: Reported on 02/05/2018 04/07/16   Maren Reamer, MD    Family History Family History  Problem Relation Age of Onset  . Diabetes Mother   . Asthma Other     Social History Social History   Tobacco Use  . Smoking status: Former Research scientist (life sciences)  . Smokeless tobacco: Never Used  Substance Use Topics  . Alcohol use: Yes    Comment: occasional  . Drug use: No     Allergies   Patient has no known allergies.   Review of Systems Review of Systems  Constitutional: Negative for appetite change, diaphoresis, fatigue, fever and unexpected weight change.  HENT: Negative for mouth sores.   Eyes: Negative for visual disturbance.  Respiratory: Negative for cough, chest tightness, shortness of breath and wheezing.   Cardiovascular: Negative for chest pain.  Gastrointestinal: Positive for abdominal pain and nausea. Negative for constipation, diarrhea and vomiting.  Endocrine: Negative for polydipsia, polyphagia and polyuria.  Genitourinary: Negative for dysuria, frequency, hematuria and urgency.  Musculoskeletal: Negative for back pain and neck stiffness.  Skin: Negative for rash.  Allergic/Immunologic: Negative for immunocompromised state.  Neurological: Negative for syncope, light-headedness and headaches.  Hematological: Does not bruise/bleed easily.  Psychiatric/Behavioral: Negative for sleep disturbance. The patient is not nervous/anxious.      Physical Exam Updated Vital Signs BP 117/87   Pulse 64   Temp 98.5 F (36.9 C) (Oral)    Resp 18   SpO2 99%   Physical Exam Vitals signs and nursing note reviewed.  Constitutional:      General: He is not in acute distress.    Appearance: He is well-developed. He is not diaphoretic.     Comments: Awake, alert, nontoxic appearance  HENT:     Head: Normocephalic and atraumatic.     Mouth/Throat:     Pharynx: No oropharyngeal exudate.  Eyes:     General: No scleral icterus.    Conjunctiva/sclera: Conjunctivae normal.  Neck:     Musculoskeletal: Normal range of motion and neck supple.  Cardiovascular:     Rate and Rhythm: Normal rate and regular rhythm.  Pulmonary:     Effort: Pulmonary effort is normal. No respiratory distress.     Breath sounds: Normal breath sounds. No wheezing.  Abdominal:     General: Bowel sounds are normal.     Palpations: Abdomen is soft. There is no mass.     Tenderness: There is abdominal tenderness in the right upper quadrant. There is no right CVA tenderness,  left CVA tenderness, guarding or rebound. Negative signs include Murphy's sign.     Comments: Mild right upper quadrant abdominal tenderness without rebound or guarding.  Negative Murphy sign.  Musculoskeletal: Normal range of motion.  Skin:    General: Skin is warm and dry.  Neurological:     Mental Status: He is alert.     Comments: Speech is clear and goal oriented Moves extremities without ataxia      ED Treatments / Results  Labs (all labs ordered are listed, but only abnormal results are displayed) Labs Reviewed  COMPREHENSIVE METABOLIC PANEL - Abnormal; Notable for the following components:      Result Value   Sodium 132 (*)    All other components within normal limits  LIPASE, BLOOD  CBC  URINALYSIS, ROUTINE W REFLEX MICROSCOPIC     Radiology US Abdomen Limited  Result Date: 02/06/2019 CLINICAL DATA:  Right upper quadrant pain 5 days. EXAM: ULTRASOUND ABDOMEN LIMITED RIGHT UPPER QUADRANT COMPARISON:  None. FINDINGS: Gallbladder: Gallstones are noted. No wall  thickening visualized. No sonographic Murphy sign noted by sonographer. Common bile duct: Diameter: 3.2 mm Liver: No focal lesion identified. There is diffuse increased echotexture of the liver. Portal vein is patent on color Doppler imaging with normal direction of blood flow towards the liver. IMPRESSION: Cholelithiasis without sonographic evidence of acute cholecystitis. Fatty infiltration of liver. Electronically Signed   By: Abelardo Diesel M.D.   On: 02/06/2019 03:05    Procedures Procedures (including critical care time)  Medications Ordered in ED Medications  alum & mag hydroxide-simeth (MAALOX/MYLANTA) 200-200-20 MG/5ML suspension 30 mL (30 mLs Oral Given 02/06/19 0243)    And  lidocaine (XYLOCAINE) 2 % viscous mouth solution 15 mL (15 mLs Oral Given 02/06/19 0243)     Initial Impression / Assessment and Plan / ED Course  I have reviewed the triage vital signs and the nursing notes.  Pertinent labs & imaging results that were available during my care of the patient were reviewed by me and considered in my medical decision making (see chart for details).     Patient presents with several days of waxing and waning but worsening right upper quadrant abdominal pain associated with nausea.  No vomiting.  She has no abdominal surgical history.  He does have a history of GERD and believes this is the same.  Labs are reassuring.  No elevation in AST, ALT or lipase however patient does have right upper quadrant abdominal tenderness on exam.  Ultrasound shows cholelithiasis without evidence of cholecystitis.  Patient is afebrile without leukocytosis.  No evidence of dilated or obstructed common bile duct.  Patient does have some symptoms consistent with GERD.  We will begin treating for GERD and have patient follow-up outpatient with general surgery for Coley lithiasis.  Discussed reasons to return immediately to the emergency department including development of persistent vomiting, worsening pain,  fevers or other concerns.  Patient states understanding and is in agreement with the plan.  Final Clinical Impressions(s) / ED Diagnoses   Final diagnoses:  RUQ abdominal pain  Calculus of gallbladder without cholecystitis without obstruction    ED Discharge Orders         Ordered    omeprazole (PRILOSEC) 20 MG capsule  Daily     02/06/19 0425    ranitidine (ZANTAC) 150 MG tablet  2 times daily     02/06/19 0425    ondansetron (ZOFRAN ODT) 4 MG disintegrating tablet     02/06/19 0428  Chigozie Basaldua, Gwenlyn Perking 02/06/19 0507    Orpah Greek, MD 02/06/19 [phone]

## 2019-02-06 NOTE — ED Notes (Signed)
ED Provider at bedside. 

## 2019-02-06 NOTE — Discharge Instructions (Signed)
1. Medications: zofran, omeprazole, zantac usual home medications 2. Treatment: rest, drink plenty of fluids, advance diet slowly 3. Follow Up: Please followup with your primary doctor in 2 days for discussion of your diagnoses and further evaluation after today's visit; if you do not have a primary care doctor use the resource guide provided to find one; Please return to the ER for persistent vomiting, high fevers or worsening symptoms

## 2019-03-08 ENCOUNTER — Telehealth: Payer: Self-pay | Admitting: Family Medicine

## 2019-03-08 MED ORDER — SIMVASTATIN 40 MG PO TABS: 40.0000 mg | ORAL_TABLET | Freq: Every day | ORAL | 0 refills | Status: AC

## 2019-03-08 NOTE — Telephone Encounter (Signed)
1) Medication(s) Requested (by name): simvastatin (ZOCOR) 40 MG tablet   2) Pharmacy of Choice: [facility] 3) Special Requests:   Approved medications will be sent to the pharmacy, we will reach out if there is an issue.  Requests made after 3pm may not be addressed until the following business day!  If a patient is unsure of the name of the medication(s) please note and ask patient to call back when they are able to provide all info, do not send to responsible party until all information is available!

## 2019-03-10 ENCOUNTER — Ambulatory Visit: Payer: Self-pay | Admitting: Family Medicine

## 2019-05-06 ENCOUNTER — Ambulatory Visit (HOSPITAL_BASED_OUTPATIENT_CLINIC_OR_DEPARTMENT_OTHER): Payer: Self-pay | Attending: Family Medicine

## 2020-05-18 IMAGING — US US ABDOMEN LIMITED
1 series · 14 of 25 positions shown · non-contrast
Comparison: None.

CLINICAL DATA: Right upper quadrant pain 5 days.

EXAM:
ULTRASOUND ABDOMEN LIMITED RIGHT UPPER QUADRANT

[Series 1: us abdomen limited · 14 of 47 slices shown]
[im 1/47]
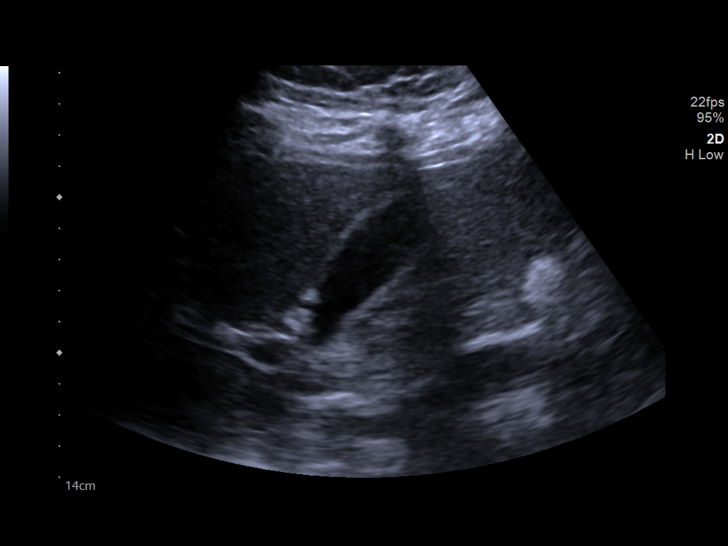
[im 4/47]
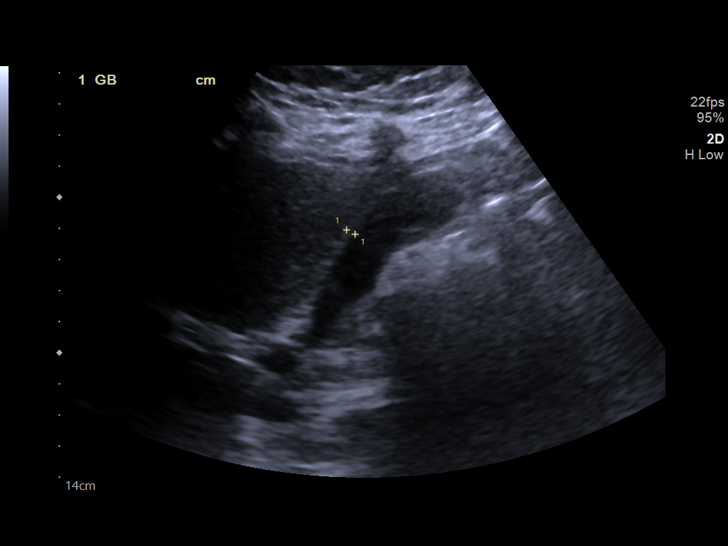
[im 8/47]
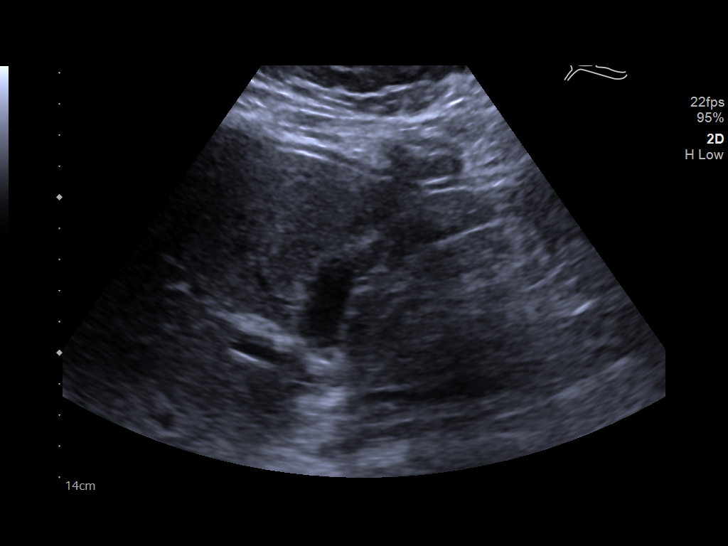
[im 12/47]
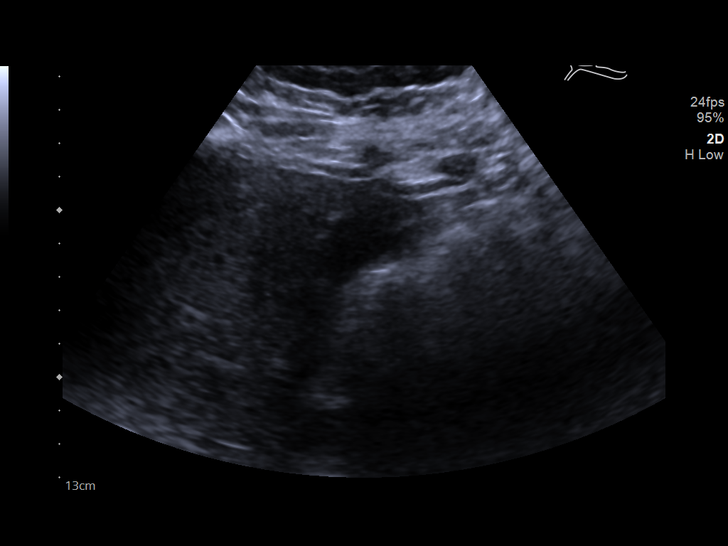
[im 16/47]
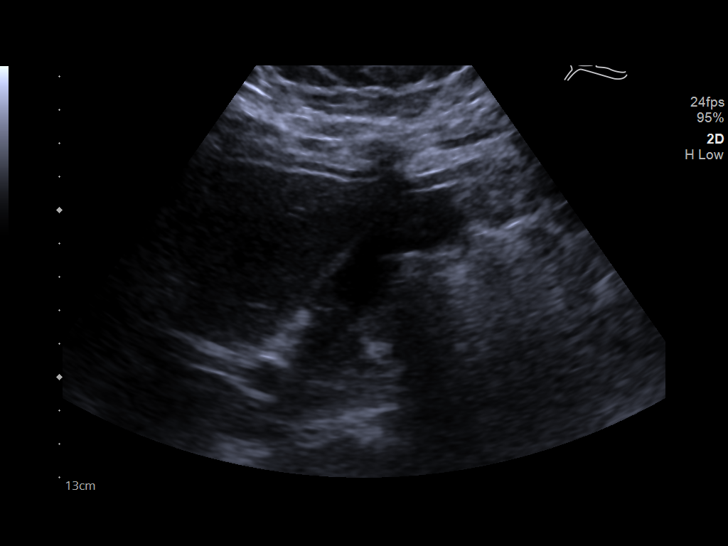
[im 18/47]
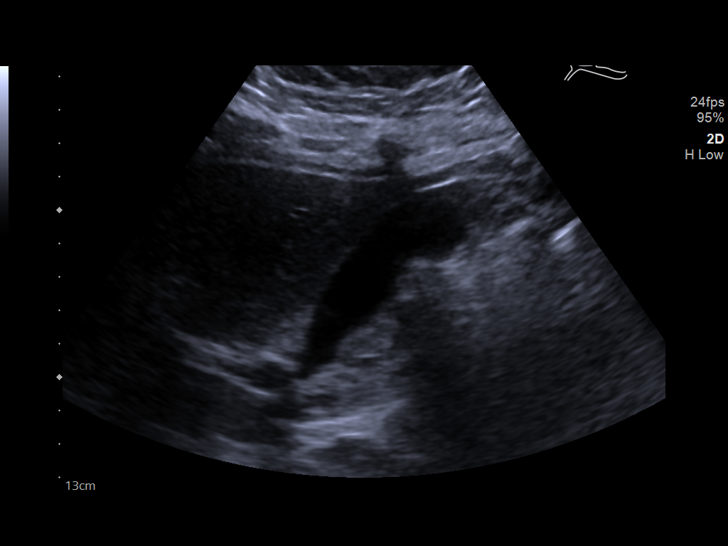
[im 22/47]
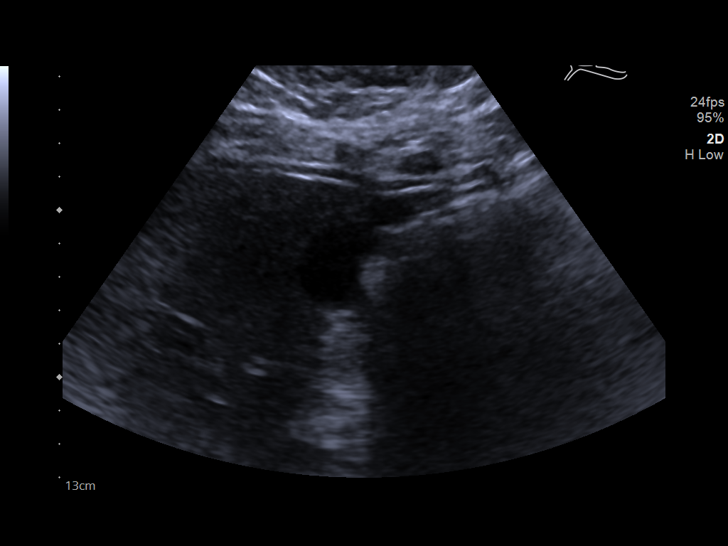
[im 25/47]
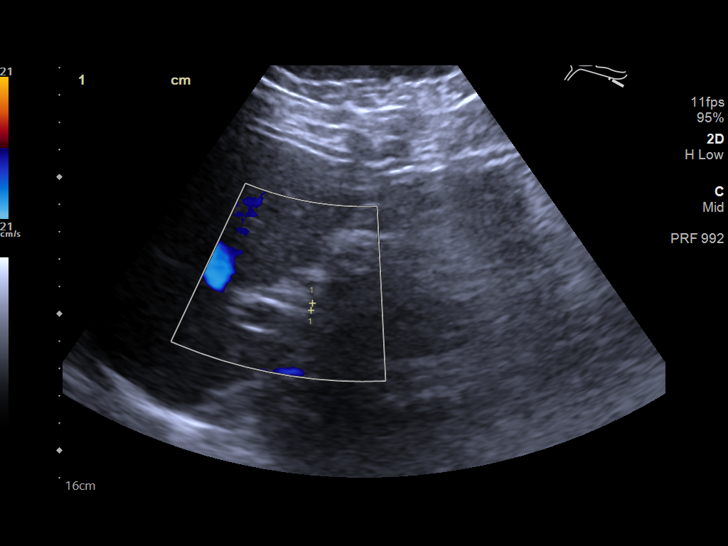
[im 29/47]
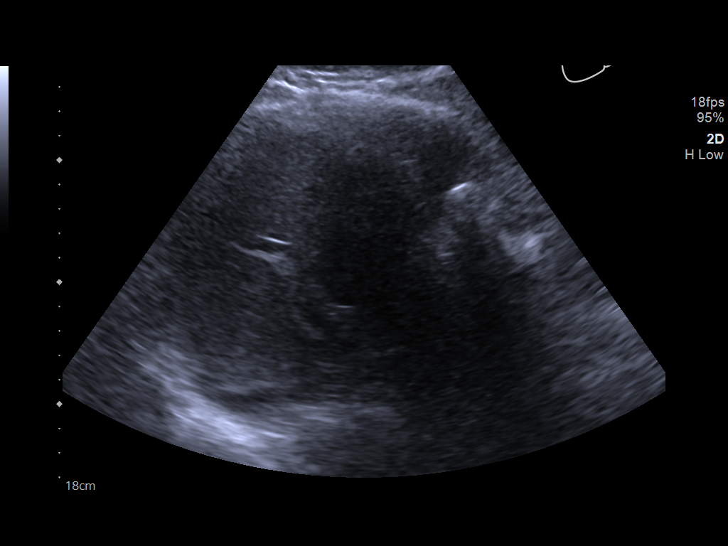
[im 31/47]
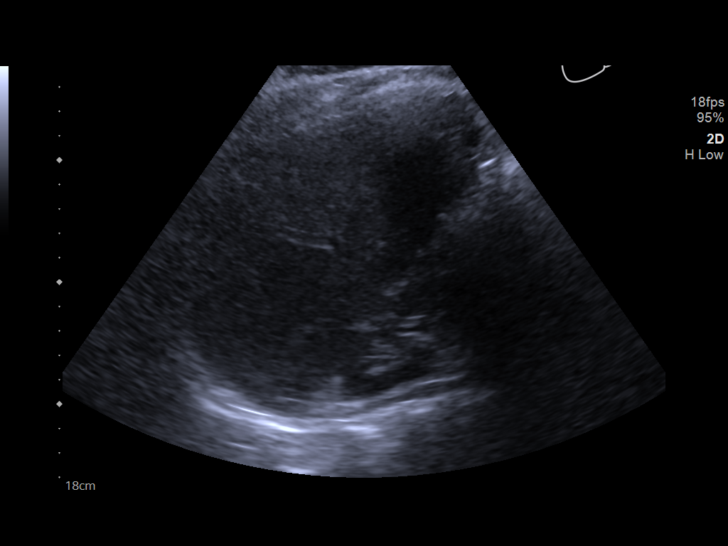
[im 35/47]
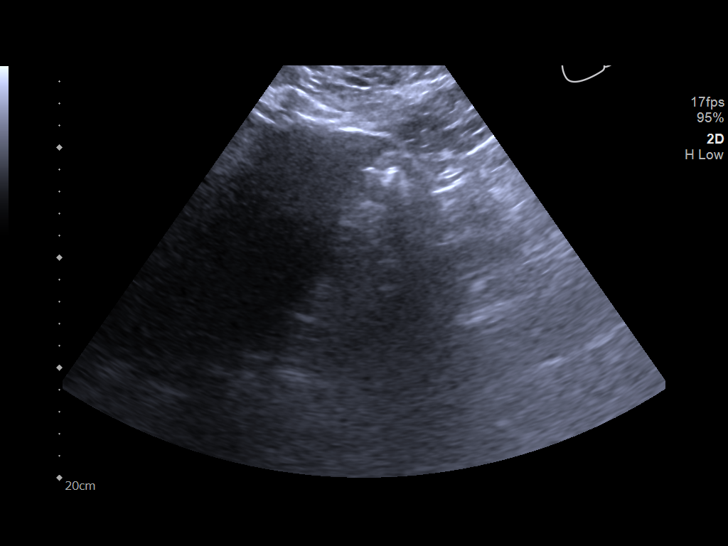
[im 39/47]
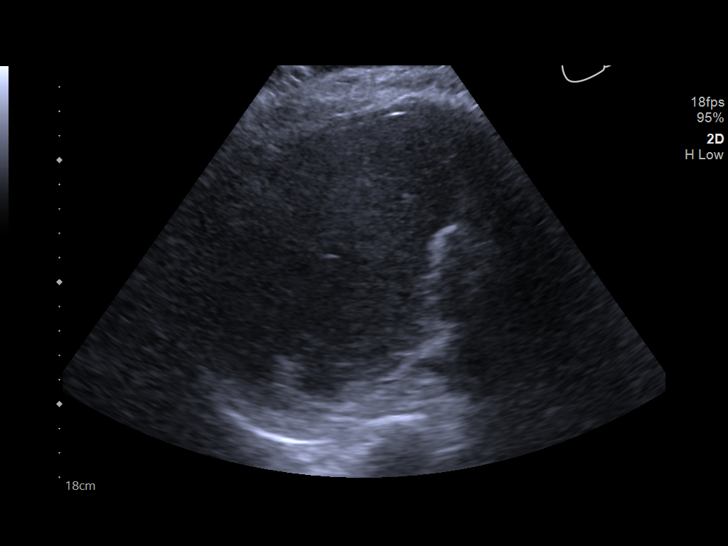
[im 43/47]
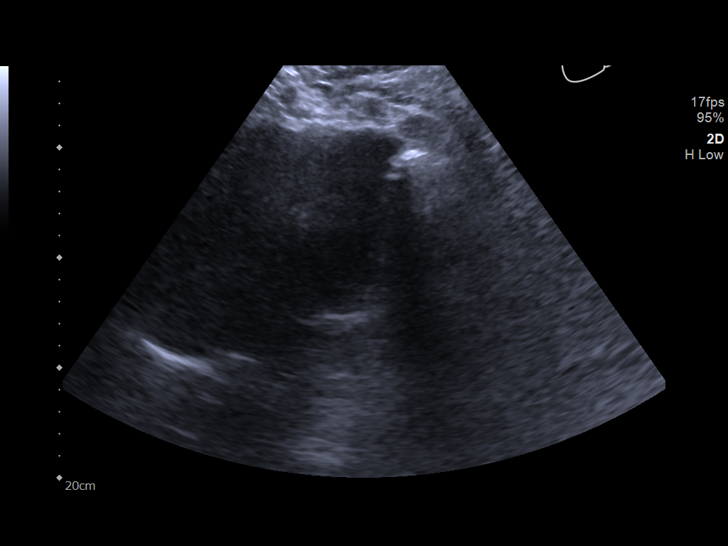
[im 47/47]
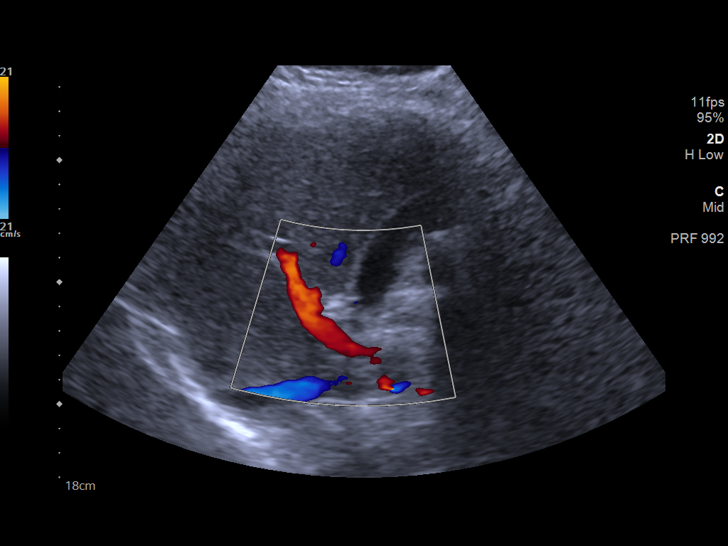

[14 of 25 positions shown; findings below may reference images not displayed]

FINDINGS: Gallbladder:

Gallstones are noted. No wall thickening visualized. No sonographic
Murphy sign noted by sonographer.

Common bile duct:

Diameter: 3.2 mm

Liver:

No focal lesion identified. There is diffuse increased echotexture
of the liver. Portal vein is patent on color Doppler imaging with
normal direction of blood flow towards the liver.
IMPRESSION: Cholelithiasis without sonographic evidence of acute cholecystitis.

Fatty infiltration of liver.
